# Patient Record
Sex: Female | Born: 1950 | Race: Black or African American | Hispanic: No | Smoking: Never smoker
Health system: Southern US, Community
[De-identification: ages and names within clinical notes are randomized; demographics above are authoritative.]

## PROBLEM LIST (undated history)

## (undated) DIAGNOSIS — F419 Anxiety disorder, unspecified: Secondary | ICD-10-CM

## (undated) DIAGNOSIS — I10 Essential (primary) hypertension: Secondary | ICD-10-CM

## (undated) DIAGNOSIS — G4700 Insomnia, unspecified: Secondary | ICD-10-CM

## (undated) DIAGNOSIS — K219 Gastro-esophageal reflux disease without esophagitis: Secondary | ICD-10-CM

## (undated) DIAGNOSIS — R0789 Other chest pain: Secondary | ICD-10-CM

## (undated) DIAGNOSIS — E785 Hyperlipidemia, unspecified: Secondary | ICD-10-CM

## (undated) DIAGNOSIS — F32A Depression, unspecified: Secondary | ICD-10-CM

## (undated) DIAGNOSIS — G4730 Sleep apnea, unspecified: Secondary | ICD-10-CM

## (undated) HISTORY — PX: BREAST EXCISIONAL BIOPSY: SUR124

## (undated) HISTORY — PX: ABDOMINAL HYSTERECTOMY: SHX81

## (undated) HISTORY — DX: Sleep apnea, unspecified: G47.30

## (undated) HISTORY — PX: BIOPSY BREAST: PRO8

## (undated) HISTORY — DX: Depression, unspecified: F32.A

---

## 2004-10-30 ENCOUNTER — Other Ambulatory Visit: Payer: Self-pay

## 2004-10-30 ENCOUNTER — Ambulatory Visit: Payer: Self-pay | Admitting: Unknown Physician Specialty

## 2008-10-26 ENCOUNTER — Emergency Department: Payer: Self-pay | Admitting: Emergency Medicine

## 2011-02-22 ENCOUNTER — Emergency Department: Payer: Self-pay | Admitting: Emergency Medicine

## 2011-05-25 ENCOUNTER — Ambulatory Visit: Payer: Self-pay | Admitting: Obstetrics and Gynecology

## 2012-01-03 ENCOUNTER — Ambulatory Visit: Payer: Self-pay | Admitting: Unknown Physician Specialty

## 2012-01-03 HISTORY — PX: COLONOSCOPY: SHX174

## 2012-04-02 ENCOUNTER — Emergency Department: Payer: Self-pay | Admitting: Internal Medicine

## 2012-04-02 LAB — URINALYSIS, COMPLETE
Bacteria: NONE SEEN
Bilirubin,UR: NEGATIVE
Blood: NEGATIVE
Glucose,UR: NEGATIVE mg/dL
Ketone: NEGATIVE
Leukocyte Esterase: NEGATIVE
Nitrite: NEGATIVE
Ph: 6
Protein: NEGATIVE
RBC,UR: <1 /HPF
Specific Gravity: 1.012
Squamous Epithelial: NONE SEEN
WBC UR: 1 /HPF

## 2012-04-02 LAB — COMPREHENSIVE METABOLIC PANEL WITH GFR
Albumin: 3.7 g/dL
Alkaline Phosphatase: 134 U/L
Anion Gap: 7
BUN: 13 mg/dL
Bilirubin,Total: 0.4 mg/dL
Calcium, Total: 8.9 mg/dL
Chloride: 108 mmol/L — ABNORMAL HIGH
Co2: 26 mmol/L
Creatinine: 0.97 mg/dL
EGFR (African American): >60
EGFR (Non-African Amer.): >60
Glucose: 120 mg/dL — ABNORMAL HIGH
Osmolality: 283
Potassium: 3.2 mmol/L — ABNORMAL LOW
SGOT(AST): 33 U/L
SGPT (ALT): 33 U/L
Sodium: 141 mmol/L
Total Protein: 7.5 g/dL

## 2012-04-02 LAB — CBC
HCT: 34.5 % — ABNORMAL LOW
HGB: 11.5 g/dL — ABNORMAL LOW
MCH: 29.7 pg
MCHC: 33.3 g/dL
MCV: 89 fL
Platelet: 266 x10 3/mm 3
RBC: 3.86 X10 6/mm 3
RDW: 14.8 % — ABNORMAL HIGH
WBC: 5.3 x10 3/mm 3

## 2012-04-02 LAB — TROPONIN I: Troponin-I: <0.02 ng/mL

## 2014-03-09 ENCOUNTER — Emergency Department: Payer: Self-pay | Admitting: Emergency Medicine

## 2014-03-09 LAB — BASIC METABOLIC PANEL
ANION GAP: 7 (ref 7–16)
BUN: 16 mg/dL (ref 7–18)
CALCIUM: 8.5 mg/dL (ref 8.5–10.1)
CHLORIDE: 107 mmol/L (ref 98–107)
CO2: 31 mmol/L (ref 21–32)
CREATININE: 1.04 mg/dL (ref 0.60–1.30)
EGFR (Non-African Amer.): 57 — ABNORMAL LOW
Glucose: 107 mg/dL — ABNORMAL HIGH (ref 65–99)
Osmolality: 290 (ref 275–301)
Potassium: 3.4 mmol/L — ABNORMAL LOW (ref 3.5–5.1)
Sodium: 145 mmol/L (ref 136–145)

## 2014-03-09 LAB — CBC
HCT: 37.5 % (ref 35.0–47.0)
HGB: 12.3 g/dL (ref 12.0–16.0)
MCH: 29.3 pg (ref 26.0–34.0)
MCHC: 32.7 g/dL (ref 32.0–36.0)
MCV: 90 fL (ref 80–100)
PLATELETS: 289 10*3/uL (ref 150–440)
RBC: 4.18 10*6/uL (ref 3.80–5.20)
RDW: 14.8 % — AB (ref 11.5–14.5)
WBC: 5.8 10*3/uL (ref 3.6–11.0)

## 2014-03-09 LAB — TROPONIN I: Troponin-I: <0.02 ng/mL

## 2014-03-09 LAB — HEPATIC FUNCTION PANEL A (ARMC)
ALBUMIN: 3.9 g/dL (ref 3.4–5.0)
ALK PHOS: 133 U/L — AB
Bilirubin, Direct: <0.1 mg/dL (ref 0.0–0.2)
Bilirubin,Total: 0.2 mg/dL (ref 0.2–1.0)
SGOT(AST): 29 U/L (ref 15–37)
SGPT (ALT): 52 U/L
Total Protein: 8 g/dL (ref 6.4–8.2)

## 2014-03-09 LAB — LIPASE, BLOOD: LIPASE: 121 U/L (ref 73–393)

## 2014-03-10 LAB — URINALYSIS, COMPLETE
BILIRUBIN, UR: NEGATIVE
Bacteria: NONE SEEN
Blood: NEGATIVE
GLUCOSE, UR: NEGATIVE mg/dL (ref 0–75)
KETONE: NEGATIVE
Leukocyte Esterase: NEGATIVE
Nitrite: NEGATIVE
Ph: 5 (ref 4.5–8.0)
Protein: NEGATIVE
RBC,UR: 1 /HPF (ref 0–5)
SPECIFIC GRAVITY: 1.012 (ref 1.003–1.030)
WBC UR: NONE SEEN /HPF (ref 0–5)

## 2014-10-11 ENCOUNTER — Emergency Department
Admission: EM | Admit: 2014-10-11 | Discharge: 2014-10-12 | Disposition: A | Discharge status: Home / Self Care | Payer: No Typology Code available for payment source | Attending: Emergency Medicine | Admitting: Emergency Medicine

## 2014-10-11 DIAGNOSIS — Z7951 Long term (current) use of inhaled steroids: Secondary | ICD-10-CM | POA: Diagnosis not present

## 2014-10-11 DIAGNOSIS — Z79899 Other long term (current) drug therapy: Secondary | ICD-10-CM | POA: Diagnosis not present

## 2014-10-11 DIAGNOSIS — R350 Frequency of micturition: Secondary | ICD-10-CM | POA: Diagnosis present

## 2014-10-11 DIAGNOSIS — I10 Essential (primary) hypertension: Secondary | ICD-10-CM | POA: Insufficient documentation

## 2014-10-11 DIAGNOSIS — Z880 Allergy status to penicillin: Secondary | ICD-10-CM | POA: Diagnosis not present

## 2014-10-11 DIAGNOSIS — N10 Acute tubulo-interstitial nephritis: Secondary | ICD-10-CM | POA: Insufficient documentation

## 2014-10-11 DIAGNOSIS — N12 Tubulo-interstitial nephritis, not specified as acute or chronic: Secondary | ICD-10-CM

## 2014-10-11 DIAGNOSIS — R000 Tachycardia, unspecified: Secondary | ICD-10-CM | POA: Diagnosis not present

## 2014-10-11 HISTORY — DX: Insomnia, unspecified: G47.00

## 2014-10-11 HISTORY — DX: Essential (primary) hypertension: I10

## 2014-10-11 HISTORY — DX: Gastro-esophageal reflux disease without esophagitis: K21.9

## 2014-10-11 HISTORY — DX: Hyperlipidemia, unspecified: E78.5

## 2014-10-11 HISTORY — DX: Anxiety disorder, unspecified: F41.9

## 2014-10-11 LAB — CBC
HCT: 40.8 % (ref 35.0–47.0)
Hemoglobin: 13.2 g/dL (ref 12.0–16.0)
MCH: 28.8 pg (ref 26.0–34.0)
MCHC: 32.5 g/dL (ref 32.0–36.0)
MCV: 88.7 fL (ref 80.0–100.0)
Platelets: 295 10*3/uL (ref 150–440)
RBC: 4.6 MIL/uL (ref 3.80–5.20)
RDW: 14.7 % — ABNORMAL HIGH (ref 11.5–14.5)
WBC: 7.8 10*3/uL (ref 3.6–11.0)

## 2014-10-11 LAB — URINALYSIS COMPLETE WITH MICROSCOPIC (ARMC ONLY)
BILIRUBIN URINE: NEGATIVE
GLUCOSE, UA: NEGATIVE mg/dL
KETONES UR: NEGATIVE mg/dL
Nitrite: NEGATIVE
Protein, ur: 100 mg/dL — AB
SPECIFIC GRAVITY, URINE: 1.011 (ref 1.005–1.030)
pH: 6 (ref 5.0–8.0)

## 2014-10-11 LAB — COMPREHENSIVE METABOLIC PANEL
ALT: 21 U/L (ref 14–54)
AST: 25 U/L (ref 15–41)
Albumin: 4.5 g/dL (ref 3.5–5.0)
Alkaline Phosphatase: 121 U/L (ref 38–126)
Anion gap: 9 (ref 5–15)
BUN: 10 mg/dL (ref 6–20)
CALCIUM: 9.4 mg/dL (ref 8.9–10.3)
CO2: 30 mmol/L (ref 22–32)
Chloride: 98 mmol/L — ABNORMAL LOW (ref 101–111)
Creatinine, Ser: 1.07 mg/dL — ABNORMAL HIGH (ref 0.44–1.00)
GFR calc Af Amer: >60 mL/min (ref >60)
GFR calc non Af Amer: 53 mL/min — ABNORMAL LOW (ref >60)
GLUCOSE: 129 mg/dL — AB (ref 65–99)
Potassium: 3 mmol/L — ABNORMAL LOW (ref 3.5–5.1)
Sodium: 137 mmol/L (ref 135–145)
Total Bilirubin: 0.6 mg/dL (ref 0.3–1.2)
Total Protein: 8.2 g/dL — ABNORMAL HIGH (ref 6.5–8.1)

## 2014-10-11 NOTE — ED Notes (Signed)
Patient to ED with c/o urinary frequency and pressure for the last 3 days, today reports she began having chills and pain to both flanks.

## 2014-10-12 MED ORDER — SODIUM CHLORIDE 0.9 % IV BOLUS (SEPSIS)
1000.0000 mL | Freq: Once | INTRAVENOUS | Status: AC
  Administered 2014-10-12: 1000 mL via INTRAVENOUS

## 2014-10-12 MED ORDER — ACETAMINOPHEN 500 MG PO TABS
ORAL_TABLET | ORAL | Status: AC
  Administered 2014-10-12: 1000 mg via ORAL
  Filled 2014-10-12: qty 2

## 2014-10-12 MED ORDER — LEVOFLOXACIN IN D5W 750 MG/150ML IV SOLN
750.0000 mg | Freq: Once | INTRAVENOUS | Status: AC
  Administered 2014-10-12: 750 mg via INTRAVENOUS

## 2014-10-12 MED ORDER — LEVOFLOXACIN IN D5W 750 MG/150ML IV SOLN
INTRAVENOUS | Status: AC
  Administered 2014-10-12: 750 mg via INTRAVENOUS
  Filled 2014-10-12: qty 150

## 2014-10-12 MED ORDER — ACETAMINOPHEN 500 MG PO TABS
1000.0000 mg | ORAL_TABLET | Freq: Once | ORAL | Status: AC
  Administered 2014-10-12: 1000 mg via ORAL

## 2014-10-12 MED ORDER — LEVOFLOXACIN 750 MG PO TABS: 750.0000 mg | ORAL_TABLET | Freq: Every day | ORAL | Status: AC

## 2014-10-12 NOTE — ED Provider Notes (Signed)
[facility] Emergency Department Provider Note  ____________________________________________  Time seen: Approximately 12 AM  I have reviewed the triage vital signs and the nursing notes.   HISTORY  Chief Complaint Urinary Frequency and Chills    HPI Sierra Lewis is a 64 y.o. female who presents with 3 days of chills as well as pain to both flanks and her frequency and pressure.  No fever at home. No nausea or vomiting or diarrhea. Notes associated pressure in her lower abdomen. Denies vaginal discharge or bleeding. Pain is mild.   Past Medical History  Diagnosis Date  . Hypertension   . Hyperlipidemia   . GERD (gastroesophageal reflux disease)   . Insomnia   . Anxiety     There are no active problems to display for this patient.   Past Surgical History  Procedure Laterality Date  . Abdominal hysterectomy    . Biopsy breast      Current Outpatient Rx  Name  Route  Sig  Dispense  Refill  . ALPRAZolam (XANAX) 1 MG tablet   Oral   Take 1 mg by mouth 3 (three) times daily.         . cetirizine (ZYRTEC) 10 MG tablet   Oral   Take 10 mg by mouth daily.         . fluticasone (FLONASE) 50 MCG/ACT nasal spray   Each Nare   Place 2 sprays into both nostrils daily.         Marland Kitchen lisinopril-hydrochlorothiazide (PRINZIDE,ZESTORETIC) 20-25 MG per tablet   Oral   Take 1 tablet by mouth daily.         . metoprolol succinate (TOPROL-XL) 50 MG 24 hr tablet   Oral   Take 50 mg by mouth daily. Take with or immediately following a meal.         . omeprazole (PRILOSEC) 40 MG capsule   Oral   Take 40 mg by mouth daily.         Marland Kitchen PARoxetine (PAXIL) 20 MG tablet   Oral   Take 20 mg by mouth at bedtime.         . simvastatin (ZOCOR) 40 MG tablet   Oral   Take 40 mg by mouth at bedtime.         Marland Kitchen zolpidem (AMBIEN) 10 MG tablet   Oral   Take 10 mg by mouth at bedtime as needed for sleep.           Allergies Benadryl and  Penicillins  No family history on file.  Social History History  Substance Use Topics  . Smoking status: Never Smoker   . Smokeless tobacco: Not on file  . Alcohol Use: Yes     Comment: Wine minimal...every other week.    Review of Systems Constitutional: chills Eyes: No visual changes. ENT: No sore throat. Cardiovascular: Denies chest pain. Respiratory: Denies shortness of breath. Gastrointestinal: No nausea, no vomiting.  No diarrhea.  No constipation. Genitourinary: Negative for dysuria. Musculoskeletal: Bilateral flank pain  Skin: Negative for rash. Neurological: Negative for headaches, focal weakness or numbness.  10-point ROS otherwise negative.  ____________________________________________   PHYSICAL EXAM:  VITAL SIGNS: ED Triage Vitals  Enc Vitals Group     BP 10/11/14 2204 168/108 mmHg     Pulse Rate 10/11/14 2204 94     Resp 10/11/14 2230 15     Temp 10/11/14 2204 100.2 F (37.9 C)     Temp Source 10/11/14 2204 Oral  SpO2 10/11/14 2230 98 %     Weight 10/11/14 2204 185 lb (83.915 kg)     Height 10/11/14 2204 5\' 1"  (1.549 m)     Head Cir --      Peak Flow --      Pain Score 10/11/14 2157 6     Pain Loc --      Pain Edu? --      Excl. in [place]? --     Constitutional: Alert and oriented. Well appearing and in no acute distress. Eyes: Conjunctivae are normal. PERRL. EOMI. Head: Atraumatic. Nose: No congestion/rhinnorhea. Mouth/Throat: Mucous membranes are moist.  Oropharynx non-erythematous. Neck: No stridor.   Cardiovascular: Tachycardic with regular rhythm. Grossly normal heart sounds.  Good peripheral circulation. Respiratory: Normal respiratory effort.  No retractions. Lungs CTAB. Gastrointestinal: Soft with mild suprapubic tenderness. No distention. No abdominal bruits. A lateral CVA tenderness palpation  Musculoskeletal: No lower extremity tenderness nor edema.  No joint effusions. Neurologic:  Normal speech and language. No gross focal  neurologic deficits are appreciated. Speech is normal. No gait instability. Skin:  Skin is warm, dry and intact. No rash noted. Psychiatric: Mood and affect are normal. Speech and behavior are normal.  ____________________________________________   LABS (all labs ordered are listed, but only abnormal results are displayed)  Labs Reviewed  URINALYSIS COMPLETEWITH MICROSCOPIC ([facility] ONLY) - Abnormal; Notable for the following:    Color, Urine YELLOW (*)    APPearance CLOUDY (*)    Hgb urine dipstick 1+ (*)    Protein, ur 100 (*)    Leukocytes, UA 3+ (*)    Bacteria, UA RARE (*)    Squamous Epithelial / LPF 0-5 (*)    All other components within normal limits  COMPREHENSIVE METABOLIC PANEL - Abnormal; Notable for the following:    Potassium 3.0 (*)    Chloride 98 (*)    Glucose, Bld 129 (*)    Creatinine, Ser 1.07 (*)    Total Protein 8.2 (*)    GFR calc non Af Amer 53 (*)    All other components within normal limits  CBC - Abnormal; Notable for the following:    RDW 14.7 (*)    All other components within normal limits  URINE CULTURE   ____________________________________________  EKG   ____________________________________________  RADIOLOGY   ____________________________________________   PROCEDURES   ____________________________________________   INITIAL IMPRESSION / ASSESSMENT AND PLAN / ED COURSE  Pertinent labs & imaging results that were available during my care of the patient were reviewed by me and considered in my medical decision making (see chart for details).  ----------------------------------------- 4:48 AM on 10/12/2014 -----------------------------------------  Patient resting comfortable at this time. Tolerating by mouth. Likely pyelonephritis. We'll discharge with several days more of Levaquin. Patient has defervesced and vital signs have improved. ____________________________________________   FINAL CLINICAL IMPRESSION(S) / ED  DIAGNOSES  Acute pyelonephritis. Initial visit.    Orbie Pyo, MD 10/12/14 [phone]

## 2014-10-12 NOTE — ED Notes (Signed)
MD at bedside.  Dr Orlean Bradford.

## 2014-10-12 NOTE — Discharge Instructions (Signed)
Pyelonephritis, Adult °Pyelonephritis is a kidney infection. A kidney infection can happen quickly, or it can last for a long time. °HOME CARE  °· Take your medicine (antibiotics) as told. Finish it even if you start to feel better. °· Keep all doctor visits as told. °· Drink enough fluids to keep your pee (urine) clear or pale yellow. °· Only take medicine as told by your doctor. °GET HELP RIGHT AWAY IF:  °· You have a fever or lasting symptoms for more than 2-3 days. °· You have a fever and your symptoms suddenly get worse. °· You cannot take your medicine or drink fluids as told. °· You have chills and shaking. °· You feel very weak or pass out (faint). °· You do not feel better after 2 days. °MAKE SURE YOU: °· Understand these instructions. °· Will watch your condition. °· Will get help right away if you are not doing well or get worse. °Document Released: 05/20/2004 Document Revised: 10/12/2011 Document Reviewed: 09/30/2010 °ExitCare® Patient Information ©2015 [employer]. This information is not intended to replace advice given to you by your health care provider. Make sure you discuss any questions you have with your health care provider. ° °

## 2014-10-14 LAB — URINE CULTURE

## 2015-04-14 DIAGNOSIS — F339 Major depressive disorder, recurrent, unspecified: Secondary | ICD-10-CM | POA: Insufficient documentation

## 2015-04-14 DIAGNOSIS — F1090 Alcohol use, unspecified, uncomplicated: Secondary | ICD-10-CM | POA: Insufficient documentation

## 2015-04-19 DIAGNOSIS — F419 Anxiety disorder, unspecified: Secondary | ICD-10-CM | POA: Insufficient documentation

## 2015-04-19 DIAGNOSIS — G3184 Mild cognitive impairment, so stated: Secondary | ICD-10-CM | POA: Insufficient documentation

## 2015-05-28 ENCOUNTER — Emergency Department: Payer: No Typology Code available for payment source

## 2015-05-28 ENCOUNTER — Emergency Department
Admission: EM | Admit: 2015-05-28 | Discharge: 2015-05-28 | Disposition: A | Discharge status: Home / Self Care | Payer: No Typology Code available for payment source | Attending: Emergency Medicine | Admitting: Emergency Medicine

## 2015-05-28 DIAGNOSIS — R0602 Shortness of breath: Secondary | ICD-10-CM | POA: Insufficient documentation

## 2015-05-28 DIAGNOSIS — Z880 Allergy status to penicillin: Secondary | ICD-10-CM | POA: Insufficient documentation

## 2015-05-28 DIAGNOSIS — R110 Nausea: Secondary | ICD-10-CM | POA: Insufficient documentation

## 2015-05-28 DIAGNOSIS — I10 Essential (primary) hypertension: Secondary | ICD-10-CM | POA: Insufficient documentation

## 2015-05-28 DIAGNOSIS — Z79899 Other long term (current) drug therapy: Secondary | ICD-10-CM | POA: Insufficient documentation

## 2015-05-28 DIAGNOSIS — R079 Chest pain, unspecified: Secondary | ICD-10-CM | POA: Insufficient documentation

## 2015-05-28 LAB — CBC
HCT: 36.1 % (ref 35.0–47.0)
Hemoglobin: 12.1 g/dL (ref 12.0–16.0)
MCH: 28.8 pg (ref 26.0–34.0)
MCHC: 33.5 g/dL (ref 32.0–36.0)
MCV: 85.9 fL (ref 80.0–100.0)
Platelets: 303 10*3/uL (ref 150–440)
RBC: 4.2 MIL/uL (ref 3.80–5.20)
RDW: 14.1 % (ref 11.5–14.5)
WBC: 6.3 10*3/uL (ref 3.6–11.0)

## 2015-05-28 LAB — COMPREHENSIVE METABOLIC PANEL
ALT: 19 U/L (ref 14–54)
AST: 19 U/L (ref 15–41)
Albumin: 4.2 g/dL (ref 3.5–5.0)
Alkaline Phosphatase: 110 U/L (ref 38–126)
Anion gap: 6 (ref 5–15)
BUN: 12 mg/dL (ref 6–20)
CHLORIDE: 102 mmol/L (ref 101–111)
CO2: 29 mmol/L (ref 22–32)
CREATININE: 0.93 mg/dL (ref 0.44–1.00)
Calcium: 9.6 mg/dL (ref 8.9–10.3)
GFR calc non Af Amer: >60 mL/min (ref >60)
GLUCOSE: 102 mg/dL — AB (ref 65–99)
Potassium: 3.2 mmol/L — ABNORMAL LOW (ref 3.5–5.1)
SODIUM: 137 mmol/L (ref 135–145)
Total Bilirubin: 0.4 mg/dL (ref 0.3–1.2)
Total Protein: 7.7 g/dL (ref 6.5–8.1)

## 2015-05-28 LAB — TROPONIN I
Troponin I: <0.03 ng/mL (ref <0.031)
Troponin I: <0.03 ng/mL (ref <0.031)

## 2015-05-28 LAB — BRAIN NATRIURETIC PEPTIDE: B NATRIURETIC PEPTIDE 5: 8 pg/mL (ref 0.0–100.0)

## 2015-05-28 MED ORDER — IBUPROFEN 600 MG PO TABS
600.0000 mg | ORAL_TABLET | Freq: Once | ORAL | Status: AC
  Administered 2015-05-28: 600 mg via ORAL
  Filled 2015-05-28: qty 1

## 2015-05-28 MED ORDER — NITROGLYCERIN 0.4 MG SL SUBL
0.4000 mg | SUBLINGUAL_TABLET | SUBLINGUAL | Status: DC | PRN
  Administered 2015-05-28 (×2): 0.4 mg via SUBLINGUAL
  Filled 2015-05-28: qty 2

## 2015-05-28 NOTE — ED Provider Notes (Signed)
[facility] Emergency Department Provider Note  ____________________________________________  Time seen: Approximately [id number] AM  I have reviewed the triage vital signs and the nursing notes.   HISTORY  Chief Complaint Chest Pain    HPI Sierra Lewis is a 65 y.o. female who comes into the hospital today with some chest pain. The patient reports that she started having chest pain in December. She reports that the pain is been coming and going but it has been bothering her every day. She reports it seems to be getting worse. The patient does not take anything for the pain. She reports that she has talked to her doctor but they have not done anything for her pain. The patient rates her pain a 7-8 out of 10 in intensity and says it is sharp. She reports it hurts when her heart beats faster off beat. She said some mild shortness of breath with no sweats and no vomiting. The patient also endorses some nausea with some upper left back pain. She denies any pain with deep inspiration but reports that if her heart beats faster it does hurt. The patient cannot tolerate the pain anymore so she decided to come in for evaluation.   Past Medical History  Diagnosis Date  . Hypertension   . Hyperlipidemia   . GERD (gastroesophageal reflux disease)   . Insomnia   . Anxiety     There are no active problems to display for this patient.   Past Surgical History  Procedure Laterality Date  . Abdominal hysterectomy    . Biopsy breast      Current Outpatient Rx  Name  Route  Sig  Dispense  Refill  . amLODipine (NORVASC) 5 MG tablet   Oral   Take 5 mg by mouth daily.         . cetirizine (ZYRTEC) 10 MG tablet   Oral   Take 10 mg by mouth daily.         Marland Kitchen FLUoxetine (PROZAC) 40 MG capsule   Oral   Take 40 mg by mouth daily.         . fluticasone (FLONASE) 50 MCG/ACT nasal spray   Each Nare   Place 2 sprays into both nostrils daily.         Marland Kitchen  lisinopril-hydrochlorothiazide (PRINZIDE,ZESTORETIC) 20-25 MG per tablet   Oral   Take 1 tablet by mouth daily.         . simvastatin (ZOCOR) 40 MG tablet   Oral   Take 40 mg by mouth at bedtime.         . traZODone (DESYREL) 100 MG tablet   Oral   Take 100 mg by mouth at bedtime.         . traZODone (DESYREL) 50 MG tablet   Oral   Take 25 mg by mouth 2 (two) times daily.           Allergies Benadryl and Penicillins  No family history on file.  Social History Social History  Substance Use Topics  . Smoking status: Never Smoker   . Smokeless tobacco: Not on file  . Alcohol Use: Yes     Comment: Wine minimal...every other week.    Review of Systems Constitutional: No fever/chills Eyes: No visual changes. ENT: No sore throat. Cardiovascular:  chest pain. Respiratory:  shortness of breath. Gastrointestinal: Nausea with No abdominal pain.  No nausea, no vomiting.  No diarrhea.  No constipation. Genitourinary: Negative for dysuria. Musculoskeletal: Negative for back  pain. Skin: Negative for rash. Neurological: Negative for headaches, focal weakness or numbness.  10-point ROS otherwise negative.  ____________________________________________   PHYSICAL EXAM:  VITAL SIGNS: ED Triage Vitals  Enc Vitals Group     BP 05/28/15 0020 138/72 mmHg     Pulse Rate 05/28/15 0020 93     Resp 05/28/15 0020 18     Temp 05/28/15 0020 98.3 F (36.8 C)     Temp Source 05/28/15 0020 Oral     SpO2 05/28/15 0020 97 %     Weight 05/28/15 0020 185 lb (83.915 kg)     Height 05/28/15 0020 5\' 2"  (1.575 m)     Head Cir --      Peak Flow --      Pain Score 05/28/15 0021 7     Pain Loc --      Pain Edu? --      Excl. in [place]? --     Constitutional: Alert and oriented. Well appearing and in mild distress. Eyes: Conjunctivae are normal. PERRL. EOMI. Head: Atraumatic. Nose: No congestion/rhinnorhea. Mouth/Throat: Mucous membranes are moist.  Oropharynx  non-erythematous. Cardiovascular: Normal rate, regular rhythm. Grossly normal heart sounds.  Good peripheral circulation. Respiratory: Normal respiratory effort.  No retractions. Lungs CTAB. Gastrointestinal: Soft and nontender. No distention. Positive bowel sounds Musculoskeletal: No lower extremity tenderness nor edema.   Neurologic:  Normal speech and language.  Skin:  Skin is warm, dry and intact.  Psychiatric: Mood and affect are normal.   ____________________________________________   LABS (all labs ordered are listed, but only abnormal results are displayed)  Labs Reviewed  COMPREHENSIVE METABOLIC PANEL - Abnormal; Notable for the following:    Potassium 3.2 (*)    Glucose, Bld 102 (*)    All other components within normal limits  CBC  TROPONIN I  BRAIN NATRIURETIC PEPTIDE  TROPONIN I   ____________________________________________  EKG  ED ECG REPORT I, Loney Hering, the attending physician, personally viewed and interpreted this ECG.   Date: 05/28/2015  EKG Time: 024  Rate: 87  Rhythm: normal sinus rhythm  Axis: normal  Intervals:none  ST&T Change: none  ____________________________________________  RADIOLOGY  CXR: No active cardiopulmonary disease ____________________________________________   PROCEDURES  Procedure(s) performed: None  Critical Care performed: No  ____________________________________________   INITIAL IMPRESSION / ASSESSMENT AND PLAN / ED COURSE  Pertinent labs & imaging results that were available during my care of the patient were reviewed by me and considered in my medical decision making (see chart for details).  This is a 65 year old female who comes into the hospital with over one month of chest pain. The patient reports the pain has been coming every day. She has not been taking anything for the pain. The patient's first set of troponins are negative but I will repeat the patient's troponin and give her some glycerin  to determine if that does alleviate the pain.  I did go into the patient and she reports that the medication she received which was nitroglycerin did help decrease her blood pressure but she did still have some mild chest discomfort. I informed her that she had 2 sets of cardiac enzymes that were negative and she probably needed to see a cardiologist she asked if that was a heart doctor and then reported that she really has an appointment scheduled on Friday. I did give the patient some ibuprofen for the pain and she is ambulatory without any difficulty. The patient is in no acute distress and  has no elevation of her heart enzymes. Patient will be discharged home to follow-up with cardiology for further evaluation of her chest pain. ____________________________________________   FINAL CLINICAL IMPRESSION(S) / ED DIAGNOSES  Final diagnoses:  Chest pain, unspecified chest pain type      Loney Hering, MD 05/28/15 [phone]

## 2015-05-28 NOTE — ED Notes (Signed)
Pt in with co chest pain for a month and increased edema to ankles.

## 2015-05-28 NOTE — Discharge Instructions (Signed)
Nonspecific Chest Pain  °Chest pain can be caused by many different conditions. There is always a chance that your pain could be related to something serious, such as a heart attack or a blood clot in your lungs. Chest pain can also be caused by conditions that are not life-threatening. If you have chest pain, it is very important to follow up with your health care provider. °CAUSES  °Chest pain can be caused by: °· Heartburn. °· Pneumonia or bronchitis. °· Anxiety or stress. °· Inflammation around your heart (pericarditis) or lung (pleuritis or pleurisy). °· A blood clot in your lung. °· A collapsed lung (pneumothorax). It can develop suddenly on its own (spontaneous pneumothorax) or from trauma to the chest. °· Shingles infection (varicella-zoster virus). °· Heart attack. °· Damage to the bones, muscles, and cartilage that make up your chest wall. This can include: °¨ Bruised bones due to injury. °¨ Strained muscles or cartilage due to frequent or repeated coughing or overwork. °¨ Fracture to one or more ribs. °¨ Sore cartilage due to inflammation (costochondritis). °RISK FACTORS  °Risk factors for chest pain may include: °· Activities that increase your risk for trauma or injury to your chest. °· Respiratory infections or conditions that cause frequent coughing. °· Medical conditions or overeating that can cause heartburn. °· Heart disease or family history of heart disease. °· Conditions or health behaviors that increase your risk of developing a blood clot. °· Having had chicken pox (varicella zoster). °SIGNS AND SYMPTOMS °Chest pain can feel like: °· Burning or tingling on the surface of your chest or deep in your chest. °· Crushing, pressure, aching, or squeezing pain. °· Dull or sharp pain that is worse when you move, cough, or take a deep breath. °· Pain that is also felt in your back, neck, shoulder, or arm, or pain that spreads to any of these areas. °Your chest pain may come and go, or it may stay  constant. °DIAGNOSIS °Lab tests or other studies may be needed to find the cause of your pain. Your health care provider may have you take a test called an ambulatory ECG (electrocardiogram). An ECG records your heartbeat patterns at the time the test is performed. You may also have other tests, such as: °· Transthoracic echocardiogram (TTE). During echocardiography, sound waves are used to create a picture of all of the heart structures and to look at how blood flows through your heart. °· Transesophageal echocardiogram (TEE). This is a more advanced imaging test that obtains images from inside your body. It allows your health care provider to see your heart in finer detail. °· Cardiac monitoring. This allows your health care provider to monitor your heart rate and rhythm in real time. °· Holter monitor. This is a portable device that records your heartbeat and can help to diagnose abnormal heartbeats. It allows your health care provider to track your heart activity for several days, if needed. °· Stress tests. These can be done through exercise or by taking medicine that makes your heart beat more quickly. °· Blood tests. °· Imaging tests. °TREATMENT  °Your treatment depends on what is causing your chest pain. Treatment may include: °· Medicines. These may include: °¨ Acid blockers for heartburn. °¨ Anti-inflammatory medicine. °¨ Pain medicine for inflammatory conditions. °¨ Antibiotic medicine, if an infection is present. °¨ Medicines to dissolve blood clots. °¨ Medicines to treat coronary artery disease. °· Supportive care for conditions that do not require medicines. This may include: °¨ Resting. °¨ Applying heat   or cold packs to injured areas. °¨ Limiting activities until pain decreases. °HOME CARE INSTRUCTIONS °· If you were prescribed an antibiotic medicine, finish it all even if you start to feel better. °· Avoid any activities that bring on chest pain. °· Do not use any tobacco products, including  cigarettes, chewing tobacco, or electronic cigarettes. If you need help quitting, ask your health care provider. °· Do not drink alcohol. °· Take medicines only as directed by your health care provider. °· Keep all follow-up visits as directed by your health care provider. This is important. This includes any further testing if your chest pain does not go away. °· If heartburn is the cause for your chest pain, you may be told to keep your head raised (elevated) while sleeping. This reduces the chance that acid will go from your stomach into your esophagus. °· Make lifestyle changes as directed by your health care provider. These may include: °¨ Getting regular exercise. Ask your health care provider to suggest some activities that are safe for you. °¨ Eating a heart-healthy diet. A registered dietitian can help you to learn healthy eating options. °¨ Maintaining a healthy weight. °¨ Managing diabetes, if necessary. °¨ Reducing stress. °SEEK MEDICAL CARE IF: °· Your chest pain does not go away after treatment. °· You have a rash with blisters on your chest. °· You have a fever. °SEEK IMMEDIATE MEDICAL CARE IF:  °· Your chest pain is worse. °· You have an increasing cough, or you cough up blood. °· You have severe abdominal pain. °· You have severe weakness. °· You faint. °· You have chills. °· You have sudden, unexplained chest discomfort. °· You have sudden, unexplained discomfort in your arms, back, neck, or jaw. °· You have shortness of breath at any time. °· You suddenly start to sweat, or your skin gets clammy. °· You feel nauseous or you vomit. °· You suddenly feel light-headed or dizzy. °· Your heart begins to beat quickly, or it feels like it is skipping beats. °These symptoms may represent a serious problem that is an emergency. Do not wait to see if the symptoms will go away. Get medical help right away. Call your local emergency services (911 in the [place] Do not drive yourself to the hospital. °  °This  information is not intended to replace advice given to you by your health care provider. Make sure you discuss any questions you have with your health care provider. °  °Document Released: 01/20/2005 Document Revised: 05/03/2014 Document Reviewed: 11/16/2013 °[employer]vier Interactive Patient Education ©2016 [employer] ° °

## 2015-05-28 NOTE — ED Notes (Signed)
NAD noted at time of D/C. Pt denies questions or concerns. Pt ambulatory to the lobby at this time. Pt refused wheelchair to the lobby.  

## 2015-05-30 DIAGNOSIS — Z87898 Personal history of other specified conditions: Secondary | ICD-10-CM | POA: Insufficient documentation

## 2015-06-02 ENCOUNTER — Other Ambulatory Visit: Payer: Self-pay | Admitting: Cardiology

## 2015-06-02 DIAGNOSIS — R10A1 Flank pain, right side: Secondary | ICD-10-CM

## 2015-06-02 DIAGNOSIS — R109 Unspecified abdominal pain: Secondary | ICD-10-CM

## 2015-06-02 DIAGNOSIS — N23 Unspecified renal colic: Secondary | ICD-10-CM

## 2015-06-05 ENCOUNTER — Ambulatory Visit
Admission: RE | Admit: 2015-06-05 | Discharge: 2015-06-05 | Disposition: A | Discharge status: Home / Self Care | Payer: Medicare Other | Source: Ambulatory Visit | Attending: Cardiology | Admitting: Cardiology

## 2015-06-05 DIAGNOSIS — R109 Unspecified abdominal pain: Secondary | ICD-10-CM | POA: Insufficient documentation

## 2015-06-05 DIAGNOSIS — N23 Unspecified renal colic: Secondary | ICD-10-CM | POA: Insufficient documentation

## 2018-01-31 ENCOUNTER — Observation Stay
Admission: EM | Admit: 2018-01-31 | Discharge: 2018-02-01 | Disposition: A | Discharge status: Home / Self Care | Payer: Medicare Other | Attending: Specialist | Admitting: Specialist

## 2018-01-31 ENCOUNTER — Other Ambulatory Visit: Payer: Self-pay

## 2018-01-31 ENCOUNTER — Emergency Department: Payer: Medicare Other

## 2018-01-31 DIAGNOSIS — Z880 Allergy status to penicillin: Secondary | ICD-10-CM | POA: Diagnosis not present

## 2018-01-31 DIAGNOSIS — R0789 Other chest pain: Principal | ICD-10-CM | POA: Insufficient documentation

## 2018-01-31 DIAGNOSIS — E785 Hyperlipidemia, unspecified: Secondary | ICD-10-CM | POA: Diagnosis not present

## 2018-01-31 DIAGNOSIS — G4700 Insomnia, unspecified: Secondary | ICD-10-CM | POA: Insufficient documentation

## 2018-01-31 DIAGNOSIS — F419 Anxiety disorder, unspecified: Secondary | ICD-10-CM | POA: Insufficient documentation

## 2018-01-31 DIAGNOSIS — F329 Major depressive disorder, single episode, unspecified: Secondary | ICD-10-CM | POA: Diagnosis not present

## 2018-01-31 DIAGNOSIS — R0602 Shortness of breath: Secondary | ICD-10-CM | POA: Diagnosis present

## 2018-01-31 DIAGNOSIS — R079 Chest pain, unspecified: Secondary | ICD-10-CM

## 2018-01-31 DIAGNOSIS — Z79899 Other long term (current) drug therapy: Secondary | ICD-10-CM | POA: Insufficient documentation

## 2018-01-31 DIAGNOSIS — Z23 Encounter for immunization: Secondary | ICD-10-CM | POA: Insufficient documentation

## 2018-01-31 DIAGNOSIS — K219 Gastro-esophageal reflux disease without esophagitis: Secondary | ICD-10-CM | POA: Diagnosis not present

## 2018-01-31 DIAGNOSIS — I700 Atherosclerosis of aorta: Secondary | ICD-10-CM | POA: Diagnosis not present

## 2018-01-31 DIAGNOSIS — I10 Essential (primary) hypertension: Secondary | ICD-10-CM | POA: Insufficient documentation

## 2018-01-31 DIAGNOSIS — E876 Hypokalemia: Secondary | ICD-10-CM | POA: Diagnosis present

## 2018-01-31 DIAGNOSIS — Z888 Allergy status to other drugs, medicaments and biological substances status: Secondary | ICD-10-CM | POA: Insufficient documentation

## 2018-01-31 LAB — CBC
HCT: 37.6 % (ref 36.0–46.0)
HEMOGLOBIN: 12.4 g/dL (ref 12.0–15.0)
MCH: 29.4 pg (ref 26.0–34.0)
MCHC: 33 g/dL (ref 30.0–36.0)
MCV: 89.1 fL (ref 80.0–100.0)
NRBC: 0 % (ref 0.0–0.2)
Platelets: 307 10*3/uL (ref 150–400)
RBC: 4.22 MIL/uL (ref 3.87–5.11)
RDW: 13.7 % (ref 11.5–15.5)
WBC: 4.8 10*3/uL (ref 4.0–10.5)

## 2018-01-31 LAB — BASIC METABOLIC PANEL
ANION GAP: 7 (ref 5–15)
BUN: 14 mg/dL (ref 8–23)
CHLORIDE: 105 mmol/L (ref 98–111)
CO2: 27 mmol/L (ref 22–32)
CREATININE: 0.83 mg/dL (ref 0.44–1.00)
Calcium: 9.4 mg/dL (ref 8.9–10.3)
GFR calc non Af Amer: >60 mL/min (ref >60)
Glucose, Bld: 157 mg/dL — ABNORMAL HIGH (ref 70–99)
POTASSIUM: 3.2 mmol/L — AB (ref 3.5–5.1)
Sodium: 139 mmol/L (ref 135–145)

## 2018-01-31 LAB — HEPATIC FUNCTION PANEL
ALK PHOS: 90 U/L (ref 38–126)
ALT: 17 U/L (ref 0–44)
AST: 22 U/L (ref 15–41)
Albumin: 3.9 g/dL (ref 3.5–5.0)
Bilirubin, Direct: <0.1 mg/dL (ref 0.0–0.2)
TOTAL PROTEIN: 7.1 g/dL (ref 6.5–8.1)
Total Bilirubin: 0.4 mg/dL (ref 0.3–1.2)

## 2018-01-31 LAB — TROPONIN I: Troponin I: <0.03 ng/mL (ref <0.03)

## 2018-01-31 LAB — BRAIN NATRIURETIC PEPTIDE: B Natriuretic Peptide: 33 pg/mL (ref 0.0–100.0)

## 2018-01-31 MED ORDER — NITROGLYCERIN 0.4 MG SL SUBL
0.4000 mg | SUBLINGUAL_TABLET | SUBLINGUAL | Status: DC | PRN
  Administered 2018-01-31: 0.4 mg via SUBLINGUAL
  Filled 2018-01-31: qty 1

## 2018-01-31 MED ORDER — NITROGLYCERIN 2 % TD OINT
1.0000 [in_us] | TOPICAL_OINTMENT | Freq: Once | TRANSDERMAL | Status: AC
  Administered 2018-01-31: 1 [in_us] via TOPICAL
  Filled 2018-01-31: qty 1

## 2018-01-31 MED ORDER — POTASSIUM CHLORIDE CRYS ER 20 MEQ PO TBCR
40.0000 meq | EXTENDED_RELEASE_TABLET | Freq: Once | ORAL | Status: AC
  Administered 2018-01-31: 40 meq via ORAL
  Filled 2018-01-31: qty 2

## 2018-01-31 MED ORDER — ASPIRIN 81 MG PO CHEW
324.0000 mg | CHEWABLE_TABLET | Freq: Once | ORAL | Status: AC
  Administered 2018-01-31: 324 mg via ORAL
  Filled 2018-01-31: qty 4

## 2018-01-31 NOTE — ED Provider Notes (Addendum)
[facility] Emergency Department Provider Note  ____________________________________________  Time seen: Approximately 7:09 PM  I have reviewed the triage vital signs and the nursing notes.   HISTORY  Chief Complaint Chest Pain    HPI Sierra Lewis is a 67 y.o. female a history of hypertension, hyperlipidemia and GERD presenting for chest pain.  The patient reports that for weeks, she has had a constant dull central chest pain which occasionally has a squeezing component to it.  It is present all day and all night, and there is nothing that she can do to make it better or worse.  It is not associated with exertion, position, or food.  She occasionally has shortness of breath, which is worse when she sits or lays down.  She denies any lower extremity swelling, calf pain, or personal history of blood clots.  The patient saw her primary care physician yesterday, who started her on clonidine for hypertension; she is here today because despite taking clonidine and improvement in her blood pressure, she continues to be symptomatic.  Last stress test was greater than 12 years ago.  FH: Mother with blood clots.  Past Medical History:  Diagnosis Date  . Anxiety   . GERD (gastroesophageal reflux disease)   . Hyperlipidemia   . Hypertension   . Insomnia     There are no active problems to display for this patient.   Past Surgical History:  Procedure Laterality Date  . ABDOMINAL HYSTERECTOMY    . BIOPSY BREAST      Current Outpatient Rx  . Order #: [id number] Class: Historical Med  . Order #: [id number] Class: Historical Med  . Order #: [id number] Class: Historical Med  . Order #: [id number] Class: Historical Med  . Order #: [id number] Class: Historical Med  . Order #: [id number] Class: Historical Med  . Order #: [id number] Class: Historical Med  . Order #: [id number] Class: Historical Med    Allergies Benadryl [diphenhydramine] and Penicillins  No family history on  file.  Social History Social History   Tobacco Use  . Smoking status: Never Smoker  . Smokeless tobacco: Never Used  Substance Use Topics  . Alcohol use: Yes    Comment: Wine minimal...every other week.  . Drug use: Not Currently    Review of Systems Constitutional: No fever/chills. Eyes: No visual changes. ENT: No sore throat. No congestion or rhinorrhea. Cardiovascular: Denies chest pain. Denies palpitations. Respiratory: Denies shortness of breath.  No cough. Gastrointestinal: No abdominal pain.  No nausea, no vomiting.  No diarrhea.  No constipation. Genitourinary: Negative for dysuria. Musculoskeletal: Negative for back pain. Skin: Negative for rash. Neurological: Negative for headaches. No focal numbness, tingling or weakness.     ____________________________________________   PHYSICAL EXAM:  VITAL SIGNS: ED Triage Vitals  Enc Vitals Group     BP 01/31/18 1743 (!) 153/86     Pulse Rate 01/31/18 1743 71     Resp --      Temp 01/31/18 1743 98.4 F (36.9 C)     Temp Source 01/31/18 1743 Oral     SpO2 01/31/18 1743 98 %     Weight 01/31/18 1744 186 lb (84.4 kg)     Height 01/31/18 1744 5\' 1"  (1.549 m)     Head Circumference --      Peak Flow --      Pain Score 01/31/18 1754 5     Pain Loc --      Pain Edu? --      Excl. in [place]? --  Constitutional: Alert and oriented. Answers questions appropriately. Eyes: Conjunctivae are normal.  EOMI. No scleral icterus. Head: Atraumatic. Nose: No congestion/rhinnorhea. Mouth/Throat: Mucous membranes are moist.  Neck: No stridor.  Supple.  No JVD.  No meningismus. Cardiovascular: Normal rate, regular rhythm. No murmurs, rubs or gallops.  Respiratory: Normal respiratory effort.  No accessory muscle use or retractions. Lungs CTAB.  No wheezes, rales or ronchi. Gastrointestinal: Overweight.  Soft, nontender and nondistended.  No guarding or rebound.  No peritoneal signs. Musculoskeletal: No LE edema. No ttp in the  calves or palpable cords.  Negative Homan's sign. Neurologic:  A&Ox3.  Speech is clear.  Face and smile are symmetric.  EOMI.  Moves all extremities well. Skin:  Skin is warm, dry and intact. No rash noted. Psychiatric: Mood and affect are normal. Speech and behavior are normal.  Normal judgement  ____________________________________________   LABS (all labs ordered are listed, but only abnormal results are displayed)  Labs Reviewed  BASIC METABOLIC PANEL - Abnormal; Notable for the following components:      Result Value   Potassium 3.2 (*)    Glucose, Bld 157 (*)    All other components within normal limits  CBC  TROPONIN I  TROPONIN I  HEPATIC FUNCTION PANEL  BRAIN NATRIURETIC PEPTIDE   ____________________________________________  EKG  ED ECG REPORT I, Anne-Caroline Mariea Clonts, the attending physician, personally viewed and interpreted this ECG.   Date: 01/31/2018  EKG Time: 1749  Rate: 60  Rhythm: normal sinus rhythm  Axis: leftward  Intervals:none  ST&T Change: No STEMI  ____________________________________________  RADIOLOGY  Dg Chest 2 View  Result Date: 01/31/2018 CLINICAL DATA:  Central chest pressure EXAM: CHEST - 2 VIEW COMPARISON:  05/28/2015 FINDINGS: The heart size and mediastinal contours are within normal limits. Moderate aortic atherosclerosis with slight uncoiling of the thoracic aorta similar to prior. Both lungs are clear. The visualized skeletal structures are unremarkable. IMPRESSION: No active cardiopulmonary disease.  Aortic atherosclerosis. Electronically Signed   By: Ashley Royalty M.D.   On: 01/31/2018 18:38    ____________________________________________   PROCEDURES  Procedure(s) performed: None  Procedures  Critical Care performed: No ____________________________________________   INITIAL IMPRESSION / ASSESSMENT AND PLAN / ED COURSE  Pertinent labs & imaging results that were available during my care of the patient were reviewed by  me and considered in my medical decision making (see chart for details).  67 y.o. hypertension and hyperlipidemia presenting with several weeks of constant unchanging chest pain, occasionally with shortness of breath.  Overall, the patient is mildly hypertensive at 153/86 but hemodynamically stable.  Her EKG is reassuring and shows no evidence of arrhythmia or ischemic changes.  Her chest x-ray shows no acute cardiopulmonary process and her troponin is negative.  Her blood counts are also reassuring and her electrolytes show a chronic unchanged hypokalemia; I have supplemented for her for her hypokalemia.  The patient does have multiple cardiac risk factors, and risk stratification's studies such as stress test is indicated, at least as an outpatient.  I have offered the patient inpatient versus outpatient evaluation, and she prefers to follow-up with cardiology as an outpatient.  GI causes including GERD, esophageal spasm, ulcer disease are considered but also would be atypical without any change with food or position.  PE and aortic pathology are considered but also very unlikely.  We will get a second troponin today, treat the patient with aspirin and nitroglycerin, and reevaluate her for final disposition.  ----------------------------------------- 7:53 PM on 01/31/2018 -----------------------------------------  The patient and her family wish to stay for continued evaluation and inpatient risk stratification.  The patient reports that her chest pain is "a touch" better so we will give her nitroglycerin paste at this time.   ____________________________________________  FINAL CLINICAL IMPRESSION(S) / ED DIAGNOSES  Final diagnoses:  Chest pain, unspecified type  Shortness of breath         NEW MEDICATIONS STARTED DURING THIS VISIT:  New Prescriptions   No medications on file      Eula Listen, MD 01/31/18 Docia Chuck    Eula Listen, MD 01/31/18 [phone]

## 2018-01-31 NOTE — ED Notes (Signed)
EMTALA documented on wrong pt.

## 2018-01-31 NOTE — ED Triage Notes (Signed)
Pt to the er for chest pressure in the center of chest. Pt was seen at [facility] yesterday and started on buspirone, losartan, clonidine. Pt says she is a nervous person and the buspirone is not helping.

## 2018-01-31 NOTE — ED Notes (Signed)
Pt given food to eat per request.

## 2018-02-01 ENCOUNTER — Encounter: Payer: Self-pay | Admitting: Surgery

## 2018-02-01 ENCOUNTER — Observation Stay: Payer: Medicare Other

## 2018-02-01 DIAGNOSIS — R079 Chest pain, unspecified: Secondary | ICD-10-CM | POA: Diagnosis present

## 2018-02-01 LAB — HEMOGLOBIN A1C
Hgb A1c MFr Bld: 6.4 % — ABNORMAL HIGH (ref 4.8–5.6)
Mean Plasma Glucose: 136.98 mg/dL

## 2018-02-01 LAB — NM MYOCAR MULTI W/SPECT W/WALL MOTION / EF
CHL CUP NUCLEAR SDS: 2
CHL CUP NUCLEAR SRS: 0
CHL CUP NUCLEAR SSS: 2
CSEPEW: 4.6 METS
CSEPPHR: 153 {beats}/min
Exercise duration (min): 3 min
Exercise duration (sec): 0 s
LV dias vol: 83 mL (ref 46–106)
LVSYSVOL: 28 mL
MPHR: 152 {beats}/min
NUC STRESS TID: 0.98
Percent HR: 100 %
Rest HR: 71 {beats}/min

## 2018-02-01 LAB — TSH: TSH: 2.906 u[IU]/mL (ref 0.350–4.500)

## 2018-02-01 LAB — TROPONIN I
Troponin I: <0.03 ng/mL (ref <0.03)
Troponin I: <0.03 ng/mL (ref <0.03)

## 2018-02-01 MED ORDER — LOSARTAN POTASSIUM-HCTZ 100-25 MG PO TABS: 1.0000 | ORAL_TABLET | Freq: Every day | ORAL | Status: DC

## 2018-02-01 MED ORDER — SIMVASTATIN 20 MG PO TABS
40.0000 mg | ORAL_TABLET | Freq: Every day | ORAL | Status: DC
  Administered 2018-02-01: 40 mg via ORAL
  Filled 2018-02-01: qty 2

## 2018-02-01 MED ORDER — ENOXAPARIN SODIUM 40 MG/0.4ML ~~LOC~~ SOLN
40.0000 mg | SUBCUTANEOUS | Status: DC
  Administered 2018-02-01: 40 mg via SUBCUTANEOUS
  Filled 2018-02-01: qty 0.4

## 2018-02-01 MED ORDER — INFLUENZA VAC SPLIT HIGH-DOSE 0.5 ML IM SUSY
0.5000 mL | PREFILLED_SYRINGE | INTRAMUSCULAR | Status: AC
  Administered 2018-02-01: 0.5 mL via INTRAMUSCULAR
  Filled 2018-02-01: qty 0.5

## 2018-02-01 MED ORDER — DOCUSATE SODIUM 100 MG PO CAPS
100.0000 mg | ORAL_CAPSULE | Freq: Two times a day (BID) | ORAL | Status: DC
  Administered 2018-02-01 (×2): 100 mg via ORAL
  Filled 2018-02-01 (×2): qty 1

## 2018-02-01 MED ORDER — BUSPIRONE HCL 10 MG PO TABS
10.0000 mg | ORAL_TABLET | Freq: Three times a day (TID) | ORAL | Status: DC | PRN
  Filled 2018-02-01: qty 1

## 2018-02-01 MED ORDER — CLONIDINE HCL 0.1 MG PO TABS: 0.1000 mg | ORAL_TABLET | Freq: Four times a day (QID) | ORAL | Status: DC | PRN

## 2018-02-01 MED ORDER — DIGOXIN 0.25 MG/ML IJ SOLN: 0.5000 mg | Freq: Once | INTRAMUSCULAR | Status: DC

## 2018-02-01 MED ORDER — PAROXETINE HCL 20 MG PO TABS
20.0000 mg | ORAL_TABLET | Freq: Every day | ORAL | Status: DC
  Administered 2018-02-01: 20 mg via ORAL
  Filled 2018-02-01: qty 1

## 2018-02-01 MED ORDER — ACETAMINOPHEN 650 MG RE SUPP: 650.0000 mg | Freq: Four times a day (QID) | RECTAL | Status: DC | PRN

## 2018-02-01 MED ORDER — PANTOPRAZOLE SODIUM 40 MG PO TBEC
40.0000 mg | DELAYED_RELEASE_TABLET | Freq: Every day | ORAL | Status: DC
  Administered 2018-02-01: 40 mg via ORAL
  Filled 2018-02-01: qty 1

## 2018-02-01 MED ORDER — METOPROLOL TARTRATE 50 MG PO TABS
50.0000 mg | ORAL_TABLET | Freq: Two times a day (BID) | ORAL | Status: DC
Start: 2018-02-01 — End: 2018-02-01
  Administered 2018-02-01 (×2): 50 mg via ORAL
  Filled 2018-02-01 (×2): qty 1

## 2018-02-01 MED ORDER — TECHNETIUM TC 99M TETROFOSMIN IV KIT
30.7620 | PACK | Freq: Once | INTRAVENOUS | Status: AC | PRN
  Administered 2018-02-01: 30.762 via INTRAVENOUS

## 2018-02-01 MED ORDER — POTASSIUM CHLORIDE CRYS ER 10 MEQ PO TBCR
10.0000 meq | EXTENDED_RELEASE_TABLET | Freq: Every day | ORAL | Status: DC
  Administered 2018-02-01: 10 meq via ORAL
  Filled 2018-02-01: qty 1

## 2018-02-01 MED ORDER — PNEUMOCOCCAL VAC POLYVALENT 25 MCG/0.5ML IJ INJ
0.5000 mL | INJECTION | INTRAMUSCULAR | Status: AC
  Administered 2018-02-01: 0.5 mL via INTRAMUSCULAR

## 2018-02-01 MED ORDER — FLUTICASONE PROPIONATE 50 MCG/ACT NA SUSP
2.0000 | Freq: Every day | NASAL | Status: DC | PRN
  Filled 2018-02-01: qty 16

## 2018-02-01 MED ORDER — TOPIRAMATE 25 MG PO TABS
25.0000 mg | ORAL_TABLET | Freq: Every day | ORAL | Status: DC
  Administered 2018-02-01: 25 mg via ORAL
  Filled 2018-02-01 (×2): qty 1

## 2018-02-01 MED ORDER — TECHNETIUM TC 99M TETROFOSMIN IV KIT
10.9730 | PACK | Freq: Once | INTRAVENOUS | Status: AC | PRN
  Administered 2018-02-01: 10.973 via INTRAVENOUS

## 2018-02-01 MED ORDER — HYDROCHLOROTHIAZIDE 25 MG PO TABS
25.0000 mg | ORAL_TABLET | Freq: Every day | ORAL | Status: DC
  Administered 2018-02-01: 25 mg via ORAL
  Filled 2018-02-01: qty 1

## 2018-02-01 MED ORDER — ONDANSETRON HCL 4 MG PO TABS: 4.0000 mg | ORAL_TABLET | Freq: Four times a day (QID) | ORAL | Status: DC | PRN

## 2018-02-01 MED ORDER — LOSARTAN POTASSIUM 50 MG PO TABS
100.0000 mg | ORAL_TABLET | Freq: Every day | ORAL | Status: DC
  Administered 2018-02-01: 100 mg via ORAL
  Filled 2018-02-01: qty 2

## 2018-02-01 MED ORDER — VITAMIN D (ERGOCALCIFEROL) 1.25 MG (50000 UNIT) PO CAPS: 50000.0000 [IU] | ORAL_CAPSULE | ORAL | Status: DC

## 2018-02-01 MED ORDER — ONDANSETRON HCL 4 MG/2ML IJ SOLN: 4.0000 mg | Freq: Four times a day (QID) | INTRAMUSCULAR | Status: DC | PRN

## 2018-02-01 MED ORDER — SIMVASTATIN 20 MG PO TABS: 40.0000 mg | ORAL_TABLET | Freq: Every day | ORAL | Status: DC

## 2018-02-01 MED ORDER — ACETAMINOPHEN 325 MG PO TABS: 650.0000 mg | ORAL_TABLET | Freq: Four times a day (QID) | ORAL | Status: DC | PRN

## 2018-02-01 NOTE — ED Notes (Signed)
Family at bedside. 

## 2018-02-01 NOTE — H&P (Signed)
Sierra Lewis is an 67 y.o. female.   Chief Complaint: Chest pain HPI: She with past medical history of hypertension and hyperlipidemia presents to the emergency department complaining of chest pain.  The patient reports that her central chest is felt heavy for the last 4 or 5 days.  She admits to mild relief of the chest pain when she rests.  The pain is associated with shortness of breath as well as some nausea but she denies vomiting or diaphoresis.  Upon arrival the patient's blood pressure was elevated but her heart enzymes found to be within normal limits.  Nitropaste was applied to her chest with minimal relief of her chest discomfort which prompted the emergency department staff to call the hospitalist service for admission.  Past Medical History:  Diagnosis Date  . Anxiety   . GERD (gastroesophageal reflux disease)   . Hyperlipidemia   . Hypertension   . Insomnia     Past Surgical History:  Procedure Laterality Date  . ABDOMINAL HYSTERECTOMY    . BIOPSY BREAST      No family history on file. HTN and heart disease throughout the family   Social History:  reports that she has never smoked. She has never used smokeless tobacco. She reports that she drinks alcohol. She reports that she has current or past drug history.  Allergies:  Allergies  Allergen Reactions  . Benadryl [Diphenhydramine] Swelling  . Penicillins Swelling    Has patient had a PCN reaction causing immediate rash, facial/tongue/throat swelling, SOB or lightheadedness with hypotension: Yes Has patient had a PCN reaction causing severe rash involving mucus membranes or skin necrosis: No Has patient had a PCN reaction that required hospitalization No Has patient had a PCN reaction occurring within the last 10 years: No If all of the above answers are "NO", then may proceed with Cephalosporin use.     (Not in a hospital admission)  Results for orders placed or performed during the hospital encounter of  01/31/18 (from the past 48 hour(s))  Basic metabolic panel     Status: Abnormal   Collection Time: 01/31/18  5:56 PM  Result Value Ref Range   Sodium 139 135 - 145 mmol/L   Potassium 3.2 (L) 3.5 - 5.1 mmol/L   Chloride 105 98 - 111 mmol/L   CO2 27 22 - 32 mmol/L   Glucose, Bld 157 (H) 70 - 99 mg/dL   BUN 14 8 - 23 mg/dL   Creatinine, Ser 0.83 0.44 - 1.00 mg/dL   Calcium 9.4 8.9 - 10.3 mg/dL   GFR calc non Af Amer >60 >60 mL/min   GFR calc Af Amer >60 >60 mL/min    Comment: (NOTE) The eGFR has been calculated using the CKD EPI equation. This calculation has not been validated in all clinical situations. eGFR's persistently <60 mL/min signify possible Chronic Kidney Disease.    Anion gap 7 5 - 15    Comment: Performed at [facility], [address] [place], [place] [address]  CBC     Status: None   Collection Time: 01/31/18  5:56 PM  Result Value Ref Range   WBC 4.8 4.0 - 10.5 K/uL   RBC 4.22 3.87 - 5.11 MIL/uL   Hemoglobin 12.4 12.0 - 15.0 g/dL   HCT 37.6 36.0 - 46.0 %   MCV 89.1 80.0 - 100.0 fL   MCH 29.4 26.0 - 34.0 pg   MCHC 33.0 30.0 - 36.0 g/dL   RDW 13.7 11.5 - 15.5 %  Platelets 307 150 - 400 K/uL   nRBC 0.0 0.0 - 0.2 %    Comment: Performed at [facility], [address] [place], [place] [address]  Troponin I     Status: None   Collection Time: 01/31/18  5:56 PM  Result Value Ref Range   Troponin I <0.03 <0.03 ng/mL    Comment: Performed at [facility], [address] [place],  [address]  Troponin I     Status: None   Collection Time: 01/31/18  7:25 PM  Result Value Ref Range   Troponin I <0.03 <0.03 ng/mL    Comment: Performed at [facility], [address] [place], [place] [address]  Hepatic function panel     Status: None   Collection Time: 01/31/18  7:25 PM  Result Value Ref Range   Total Protein 7.1 6.5 - 8.1 g/dL   Albumin 3.9 3.5 - 5.0 g/dL   AST 22 15 - 41 U/L   ALT 17 0 - 44 U/L   Alkaline  Phosphatase 90 38 - 126 U/L   Total Bilirubin 0.4 0.3 - 1.2 mg/dL   Bilirubin, Direct <0.1 0.0 - 0.2 mg/dL   Indirect Bilirubin NOT CALCULATED 0.3 - 0.9 mg/dL    Comment: Performed at [facility], [address] [place], [place] [address]  Brain natriuretic peptide     Status: None   Collection Time: 01/31/18  7:25 PM  Result Value Ref Range   B Natriuretic Peptide 33.0 0.0 - 100.0 pg/mL    Comment: Performed at [facility], [address] [place], [place] [address]   Dg Chest 2 View  Result Date: 01/31/2018 CLINICAL DATA:  Central chest pressure EXAM: CHEST - 2 VIEW COMPARISON:  05/28/2015 FINDINGS: The heart size and mediastinal contours are within normal limits. Moderate aortic atherosclerosis with slight uncoiling of the thoracic aorta similar to prior. Both lungs are clear. The visualized skeletal structures are unremarkable. IMPRESSION: No active cardiopulmonary disease.  Aortic atherosclerosis. Electronically Signed   By: Ashley Royalty M.D.   On: 01/31/2018 18:38    Review of Systems  Constitutional: Negative for chills and fever.  HENT: Negative for sore throat and tinnitus.   Eyes: Negative for blurred vision and redness.  Respiratory: Positive for shortness of breath. Negative for cough.   Cardiovascular: Positive for chest pain. Negative for palpitations, orthopnea and PND.  Gastrointestinal: Positive for nausea. Negative for abdominal pain, diarrhea and vomiting.  Genitourinary: Negative for dysuria, frequency and urgency.  Musculoskeletal: Negative for joint pain and myalgias.  Skin: Negative for rash.       No lesions  Neurological: Negative for speech change, focal weakness and weakness.  Endo/Heme/Allergies: Does not bruise/bleed easily.       No temperature intolerance  Psychiatric/Behavioral: Negative for depression and suicidal ideas.    Blood pressure 126/73, pulse 67, temperature 98.4 F (36.9 C), resp. rate 15, height '5\' 1"'$  (1.549 m),  weight 84.4 kg, SpO2 93 %. Physical Exam  Vitals reviewed. Constitutional: She is oriented to person, place, and time. She appears well-developed and well-nourished. No distress.  HENT:  Head: Normocephalic and atraumatic.  Mouth/Throat: Oropharynx is clear and moist.  Eyes: Pupils are equal, round, and reactive to light. Conjunctivae and EOM are normal. No scleral icterus.  Neck: Normal range of motion. Neck supple. No JVD present. No tracheal deviation present. No thyromegaly present.  Cardiovascular: Normal rate, regular rhythm and normal heart sounds. Exam reveals no gallop and  no friction rub.  No murmur heard. Respiratory: Effort normal and breath sounds normal.  GI: Soft. Bowel sounds are normal. She exhibits no distension. There is no tenderness.  Genitourinary:  Genitourinary Comments: Deferred  Musculoskeletal: Normal range of motion. She exhibits no edema.  Lymphadenopathy:    She has no cervical adenopathy.  Neurological: She is alert and oriented to person, place, and time. No cranial nerve deficit. She exhibits normal muscle tone.  Skin: Skin is warm and dry. No rash noted. No erythema.  Psychiatric: She has a normal mood and affect. Her behavior is normal. Judgment and thought content normal.     Assessment/Plan This is a 67 year old female admitted for chest pain. 1.  Chest pain: Continue to follow cardiac biomarkers.  Consult cardiology.  Monitor telemetry. 2.  Hypertension: Uncontrolled; continue clonidine, metoprolol, hydrochlorothiazide and losartan.  Consider adding calcium channel blocker. 3.  Hyperlipidemia: Continue statin therapy 4.  Hypokalemia: Replete potassium 5.  Depression: Continue BuSpar, Paxil and Topamax 6.  DVT prophylaxis: Lovenox 7.  GI prophylaxis: Pantoprazole per home regimen The patient is a full code.  Time spent on admission orders and patient care approximately 45 minutes  Harrie Foreman, MD 02/01/2018, 2:45 AM

## 2018-02-01 NOTE — Plan of Care (Signed)
Problem: Skin Integrity: Goal: Risk for impaired skin integrity will decrease 02/01/2018 0528 by Jannifer Rodney A, RN Outcome: Progressing 02/01/2018 0528 by Jannifer Rodney A, RN Outcome: Progressing   Problem: Safety: Goal: Ability to remain free from injury will improve 02/01/2018 0528 by Jannifer Rodney A, RN Outcome: Progressing 02/01/2018 0528 by Jannifer Rodney A, RN Outcome: Progressing   Problem: Pain Managment: Goal: General experience of comfort will improve 02/01/2018 0528 by Jannifer Rodney A, RN Outcome: Progressing 02/01/2018 0528 by Jannifer Rodney A, RN Outcome: Progressing   Problem: Elimination: Goal: Will not experience complications related to urinary retention 02/01/2018 0528 by Jannifer Rodney A, RN Outcome: Progressing 02/01/2018 0528 by Jannifer Rodney A, RN Outcome: Progressing   Problem: Elimination: Goal: Will not experience complications related to bowel motility 02/01/2018 0528 by Jannifer Rodney A, RN Outcome: Progressing 02/01/2018 0528 by Jannifer Rodney A, RN Outcome: Progressing   Problem: Coping: Goal: Level of anxiety will decrease 02/01/2018 0528 by Jannifer Rodney A, RN Outcome: Progressing 02/01/2018 0528 by Jannifer Rodney A, RN Outcome: Progressing   Problem: Nutrition: Goal: Adequate nutrition will be maintained 02/01/2018 0528 by Jannifer Rodney A, RN Outcome: Progressing 02/01/2018 0528 by Jannifer Rodney A, RN Outcome: Progressing   Problem: Activity: Goal: Risk for activity intolerance will decrease 02/01/2018 0528 by Jannifer Rodney A, RN Outcome: Progressing 02/01/2018 0528 by Jannifer Rodney A, RN Outcome: Progressing   Problem: Clinical Measurements: Goal: Cardiovascular complication will be avoided 02/01/2018 0528 by Jannifer Rodney A, RN Outcome: Progressing 02/01/2018 0528 by Jannifer Rodney A, RN Outcome: Progressing   Problem: Clinical Measurements: Goal: Respiratory complications will improve 02/01/2018 0528 by Jannifer Rodney A,  RN Outcome: Progressing 02/01/2018 0528 by Jannifer Rodney A, RN Outcome: Progressing   Problem: Clinical Measurements: Goal: Diagnostic test results will improve 02/01/2018 0528 by Jannifer Rodney A, RN Outcome: Progressing 02/01/2018 0528 by Jannifer Rodney A, RN Outcome: Progressing   Problem: Clinical Measurements: Goal: Will remain free from infection 02/01/2018 0528 by Jannifer Rodney A, RN Outcome: Progressing 02/01/2018 0528 by Jannifer Rodney A, RN Outcome: Progressing   Problem: Clinical Measurements: Goal: Ability to maintain clinical measurements within normal limits will improve 02/01/2018 0528 by Jannifer Rodney A, RN Outcome: Progressing 02/01/2018 0528 by Jannifer Rodney A, RN Outcome: Progressing   Problem: Health Behavior/Discharge Planning: Goal: Ability to manage health-related needs will improve 02/01/2018 0528 by Jannifer Rodney A, RN Outcome: Progressing 02/01/2018 0528 by Jannifer Rodney A, RN Outcome: Progressing   Problem: Education: Goal: Knowledge of General Education information will improve Description Including pain rating scale, medication(s)/side effects and non-pharmacologic comfort measures 02/01/2018 0528 by Jannifer Rodney A, RN Outcome: Progressing 02/01/2018 0528 by Jannifer Rodney A, RN Outcome: Progressing   Problem: Skin Integrity: Goal: Risk for impaired skin integrity will decrease 02/01/2018 0528 by Jannifer Rodney A, RN Outcome: Progressing 02/01/2018 0528 by Jannifer Rodney A, RN Outcome: Progressing   Problem: Safety: Goal: Ability to remain free from injury will improve 02/01/2018 0528 by Jannifer Rodney A, RN Outcome: Progressing 02/01/2018 0528 by Jannifer Rodney A, RN Outcome: Progressing   Problem: Pain Managment: Goal: General experience of comfort will improve 02/01/2018 0528 by Jannifer Rodney A, RN Outcome: Progressing 02/01/2018 0528 by Jannifer Rodney A, RN Outcome: Progressing   Problem: Elimination: Goal: Will not experience  complications related to urinary retention 02/01/2018 0528 by Jannifer Rodney A, RN Outcome: Progressing 02/01/2018 0528 by Jannifer Rodney A, RN Outcome: Progressing   Problem: Elimination: Goal: Will not experience complications related to bowel motility 02/01/2018 0528 by  Teige Rountree A, RN Outcome: Progressing 02/01/2018 0528 by Vergie Living, RN Outcome: Progressing   Problem: Education: Goal: Knowledge of General Education information will improve Description Including pain rating scale, medication(s)/side effects and non-pharmacologic comfort measures 02/01/2018 0528 by Jannifer Rodney A, RN Outcome: Progressing 02/01/2018 0528 by Jannifer Rodney A, RN Outcome: Progressing   Problem: Health Behavior/Discharge Planning: Goal: Ability to manage health-related needs will improve 02/01/2018 0528 by Jannifer Rodney A, RN Outcome: Progressing 02/01/2018 0528 by Jannifer Rodney A, RN Outcome: Progressing   Problem: Clinical Measurements: Goal: Ability to maintain clinical measurements within normal limits will improve 02/01/2018 0528 by Jannifer Rodney A, RN Outcome: Progressing 02/01/2018 0528 by Jannifer Rodney A, RN Outcome: Progressing   Problem: Clinical Measurements: Goal: Will remain free from infection 02/01/2018 0528 by Jannifer Rodney A, RN Outcome: Progressing 02/01/2018 0528 by Jannifer Rodney A, RN Outcome: Progressing   Problem: Clinical Measurements: Goal: Diagnostic test results will improve 02/01/2018 0528 by Jannifer Rodney A, RN Outcome: Progressing 02/01/2018 0528 by Vergie Living, RN Outcome: Progressing

## 2018-02-01 NOTE — ED Notes (Signed)
Patient is resting comfortably. 

## 2018-02-01 NOTE — Progress Notes (Signed)
Patient transferred from the ED via stretcher to room 234. Oriented patient to room and room equipment. Explained Fall Risk protocol and to call for assistance when needed. Currently patient complains of no pain or discomfort. Family at the bedside. Will continue to monitor patient to end of shift.

## 2018-02-01 NOTE — Plan of Care (Addendum)
Patient discharged after negative stress test.   She continues to report chest pressure.  Hospitalist Sainani encouraged her to take her PPI and BP meds as prescribed and instructed her to follow up with a GI physician for possible EGD if symptoms persist.  Pt's VSS on room air.  DC instructions given.  No new scrips.  She requested flu and pneumo vaccines, ordered and administered by writing RN.  She will leave hospital in car with her daughter

## 2018-02-01 NOTE — ED Notes (Signed)
ED TO INPATIENT HANDOFF REPORT  Name/Age/Gender Sierra Lewis 67 y.o. female  Code Status   Home/SNF/Other Home  Chief Complaint chest pressure  Level of Care/Admitting Diagnosis ED Disposition    ED Disposition Condition [place]: [place] [[id number]]  Level of Care: Telemetry [5]  Diagnosis: Chest pain [865784]  Admitting Physician: Harrie Foreman [[id number]]  Attending Physician: Harrie Foreman [[id number]]  PT Class (Do Not Modify): Observation [104]  PT Acc Code (Do Not Modify): Observation [10022]       Medical History Past Medical History:  Diagnosis Date  . Anxiety   . GERD (gastroesophageal reflux disease)   . Hyperlipidemia   . Hypertension   . Insomnia     Allergies Allergies  Allergen Reactions  . Benadryl [Diphenhydramine] Swelling  . Penicillins Swelling    Has patient had a PCN reaction causing immediate rash, facial/tongue/throat swelling, SOB or lightheadedness with hypotension: Yes Has patient had a PCN reaction causing severe rash involving mucus membranes or skin necrosis: No Has patient had a PCN reaction that required hospitalization No Has patient had a PCN reaction occurring within the last 10 years: No If all of the above answers are "NO", then may proceed with Cephalosporin use.    IV Location/Drains/Wounds Patient Lines/Drains/Airways Status   Active Line/Drains/Airways    Name:   Placement date:   Placement time:   Site:   Days:   Peripheral IV 05/28/15 Left Antecubital   05/28/15    0535    Antecubital   980   Peripheral IV 01/31/18 Right Antecubital   01/31/18    2005    Antecubital   1          Labs/Imaging Results for orders placed or performed during the hospital encounter of 01/31/18 (from the past 48 hour(s))  Basic metabolic panel     Status: Abnormal   Collection Time: 01/31/18  5:56 PM  Result Value Ref Range   Sodium 139 135 - 145 mmol/L   Potassium 3.2 (L) 3.5 -  5.1 mmol/L   Chloride 105 98 - 111 mmol/L   CO2 27 22 - 32 mmol/L   Glucose, Bld 157 (H) 70 - 99 mg/dL   BUN 14 8 - 23 mg/dL   Creatinine, Ser 0.83 0.44 - 1.00 mg/dL   Calcium 9.4 8.9 - 10.3 mg/dL   GFR calc non Af Amer >60 >60 mL/min   GFR calc Af Amer >60 >60 mL/min    Comment: (NOTE) The eGFR has been calculated using the CKD EPI equation. This calculation has not been validated in all clinical situations. eGFR's persistently <60 mL/min signify possible Chronic Kidney Disease.    Anion gap 7 5 - 15    Comment: Performed at [facility], [address] [place], [place] [address]  CBC     Status: None   Collection Time: 01/31/18  5:56 PM  Result Value Ref Range   WBC 4.8 4.0 - 10.5 K/uL   RBC 4.22 3.87 - 5.11 MIL/uL   Hemoglobin 12.4 12.0 - 15.0 g/dL   HCT 37.6 36.0 - 46.0 %   MCV 89.1 80.0 - 100.0 fL   MCH 29.4 26.0 - 34.0 pg   MCHC 33.0 30.0 - 36.0 g/dL   RDW 13.7 11.5 - 15.5 %   Platelets 307 150 - 400 K/uL   nRBC 0.0 0.0 - 0.2 %    Comment: Performed at [facility], [place]  [address] [place], [place] [address]  Troponin I     Status: None   Collection Time: 01/31/18  5:56 PM  Result Value Ref Range   Troponin I <0.03 <0.03 ng/mL    Comment: Performed at [facility], [address] [place], [place] [address]  Troponin I     Status: None   Collection Time: 01/31/18  7:25 PM  Result Value Ref Range   Troponin I <0.03 <0.03 ng/mL    Comment: Performed at [facility], [address] [place], [place] [address]  Hepatic function panel     Status: None   Collection Time: 01/31/18  7:25 PM  Result Value Ref Range   Total Protein 7.1 6.5 - 8.1 g/dL   Albumin 3.9 3.5 - 5.0 g/dL   AST 22 15 - 41 U/L   ALT 17 0 - 44 U/L   Alkaline Phosphatase 90 38 - 126 U/L   Total Bilirubin 0.4 0.3 - 1.2 mg/dL   Bilirubin, Direct <0.1 0.0 - 0.2 mg/dL   Indirect Bilirubin NOT CALCULATED 0.3 - 0.9 mg/dL    Comment: Performed at [facility], [address] [place], [place] [address]  Brain natriuretic peptide     Status: None   Collection Time: 01/31/18  7:25 PM  Result Value Ref Range   B Natriuretic Peptide 33.0 0.0 - 100.0 pg/mL    Comment: Performed at [facility], [address] [place], [place] [address]   Dg Chest 2 View  Result Date: 01/31/2018 CLINICAL DATA:  Central chest pressure EXAM: CHEST - 2 VIEW COMPARISON:  05/28/2015 FINDINGS: The heart size and mediastinal contours are within normal limits. Moderate aortic atherosclerosis with slight uncoiling of the thoracic aorta similar to prior. Both lungs are clear. The visualized skeletal structures are unremarkable. IMPRESSION: No active cardiopulmonary disease.  Aortic atherosclerosis. Electronically Signed   By: Ashley Royalty M.D.   On: 01/31/2018 18:38    Pending Labs [employer] (From admission, onward)    Start     Ordered   Signed and Held  Creatinine, serum  (enoxaparin (LOVENOX)    CrCl >/= 30 ml/min)  Weekly,   R    Comments:  while on enoxaparin therapy    Signed and Held   Signed and Held  TSH  Add-on,   R     Signed and Held   Signed and Held  Hemoglobin A1c  Add-on,   R     Signed and Held   Signed and Held  Troponin I  Now then every 6 hours,   R     Signed and Held          Vitals/Pain Today's Vitals   02/01/18 0100 02/01/18 0130 02/01/18 0200 02/01/18 0230  BP: 139/74 126/73 (!) 146/79 (!) 147/80  Pulse: 71 67 76 72  Resp: _0 Temp:      TempSrc:      SpO2: 95% 93% 94% 94%  Weight:      Height:      PainSc:        Isolation Precautions No active isolations  Medications Medications  nitroGLYCERIN (NITROSTAT) SL tablet 0.4 mg (0.4 mg Sublingual Given 01/31/18 1923)  potassium chloride SA (K-DUR,KLOR-CON) CR tablet 40 mEq (40 mEq Oral Given 01/31/18 1923)  aspirin chewable tablet 324 mg (324 mg Oral Given 01/31/18 1923)  nitroGLYCERIN (NITROGLYN) 2 % ointment 1 inch (1 inch Topical Given 01/31/18 2000)  Mobility walks with device

## 2018-02-01 NOTE — Care Management Note (Signed)
Case Management Note  Patient Details  Name: Sierra Lewis MRN: [id number] Date of Birth: 1950/08/03  Subjective/Objective:       Independent in all adls, denies issues accessing medical care, obtaining medications or with transportation.  Current with PCP.  No discharge needs identified at present by care manager or members of care team               Action/Plan:   Expected Discharge Date:  02/01/18               Expected Discharge Plan:  Home/Self Care  In-House Referral:     Discharge planning Services  CM Consult  Post Acute Care Choice:    Choice offered to:     DME Arranged:    DME Agency:     HH Arranged:    [place] Agency:     Status of Service:  Completed, signed off  If discussed at [employer] of Stay Meetings, dates discussed:    Additional Comments:  Elza Rafter, RN 02/01/2018, 3:01 PM

## 2018-02-01 NOTE — Care Management Obs Status (Signed)
MEDICARE OBSERVATION STATUS NOTIFICATION   Patient Details  Name: Sierra Lewis MRN: [id number] Date of Birth: 30-Aug-1950   Medicare Observation Status Notification Given:  Yes    Elza Rafter, RN 02/01/2018, 2:58 PM

## 2018-02-01 NOTE — Discharge Summary (Signed)
[facility] [place] NAME: Sierra Lewis    MR#:  [id number]  DATE OF BIRTH:  08-04-1950  DATE OF ADMISSION:  01/31/2018 ADMITTING PHYSICIAN: Harrie Foreman, MD  DATE OF DISCHARGE: 02/01/2018  3:58 PM  PRIMARY CARE PHYSICIAN: Martin Majestic, FNP    ADMISSION DIAGNOSIS:  Shortness of breath [R06.02] Hypokalemia [E87.6] Chest pain, unspecified type [R07.9]  DISCHARGE DIAGNOSIS:  Active Problems:   Chest pain   SECONDARY DIAGNOSIS:   Past Medical History:  Diagnosis Date  . Anxiety   . GERD (gastroesophageal reflux disease)   . Hyperlipidemia   . Hypertension   . Insomnia     HOSPITAL COURSE:   67 year old female with past medical history of anxiety, GERD, hypertension, hyperlipidemia, insomnia who presented to the hospital due to chest pain.  1.  Chest pain-given patient's typical symptoms and risk factors she was observed overnight in the hospital.  She had 3 sets of cardiac markers checked which were negative. -Patient underwent a exercise treadmill stress test which showed no acute evidence of ischemia or ST or T wave changes. - She continues to have some mild chest pain which is likely either GI related or musculoskeletal.  She will follow-up with her primary care physician.  She is being discharged home.  2.  Essential hypertension-patient will continue her clonidine, losartan HCTZ, Metoprolol.   3.  Hyperlipidemia-continue simvastatin.  4.  Anxiety-patient will continue her Paxil, BuSpar.  DISCHARGE CONDITIONS:   Stable.   CONSULTS OBTAINED:  Treatment Team:  Teodoro Spray, MD  DRUG ALLERGIES:   Allergies  Allergen Reactions  . Benadryl [Diphenhydramine] Swelling  . Penicillins Swelling    Has patient had a PCN reaction causing immediate rash, facial/tongue/throat swelling, SOB or lightheadedness with hypotension: Yes Has patient had a PCN reaction causing severe rash involving mucus membranes or  skin necrosis: No Has patient had a PCN reaction that required hospitalization No Has patient had a PCN reaction occurring within the last 10 years: No If all of the above answers are "NO", then may proceed with Cephalosporin use.    DISCHARGE MEDICATIONS:   Allergies as of 02/01/2018      Reactions   Benadryl [diphenhydramine] Swelling   Penicillins Swelling   Has patient had a PCN reaction causing immediate rash, facial/tongue/throat swelling, SOB or lightheadedness with hypotension: Yes Has patient had a PCN reaction causing severe rash involving mucus membranes or skin necrosis: No Has patient had a PCN reaction that required hospitalization No Has patient had a PCN reaction occurring within the last 10 years: No If all of the above answers are "NO", then may proceed with Cephalosporin use.      Medication List    TAKE these medications   busPIRone 10 MG tablet Commonly known as:  BUSPAR Take 10 mg by mouth 3 (three) times daily as needed (for anxiety).   cloNIDine 0.1 MG tablet Commonly known as:  CATAPRES Take 0.1 mg by mouth every 6 (six) hours as needed (for tachycardia). May only take 2 per day   fluticasone 50 MCG/ACT nasal spray Commonly known as:  FLONASE Place 2 sprays into both nostrils daily as needed for allergies.   losartan-hydrochlorothiazide 100-25 MG tablet Commonly known as:  HYZAAR Take 1 tablet by mouth daily.   metoprolol tartrate 50 MG tablet Commonly known as:  LOPRESSOR Take 50 mg by mouth 2 (two) times daily.   omeprazole 20 MG capsule Commonly known as:  PRILOSEC  Take 20 mg by mouth daily as needed (for acid reflux).   PARoxetine 20 MG tablet Commonly known as:  PAXIL Take 20 mg by mouth daily.   potassium chloride 10 MEQ tablet Commonly known as:  K-DUR Take 10 mEq by mouth daily.   simvastatin 40 MG tablet Commonly known as:  ZOCOR Take 40 mg by mouth at bedtime.   topiramate 25 MG tablet Commonly known as:  TOPAMAX Take 25 mg  by mouth at bedtime.   Vitamin D (Ergocalciferol) 50000 units Caps capsule Commonly known as:  DRISDOL Take 50,000 Units by mouth every Thursday.         DISCHARGE INSTRUCTIONS:   DIET:  Cardiac diet  DISCHARGE CONDITION:  Stable  ACTIVITY:  Activity as tolerated  OXYGEN:  Home Oxygen: No.   Oxygen Delivery: room air  DISCHARGE LOCATION:  home   If you experience worsening of your admission symptoms, develop shortness of breath, life threatening emergency, suicidal or homicidal thoughts you must seek medical attention immediately by calling 911 or calling your MD immediately  if symptoms less severe.  You Must read complete instructions/literature along with all the possible adverse reactions/side effects for all the Medicines you take and that have been prescribed to you. Take any new Medicines after you have completely understood and accpet all the possible adverse reactions/side effects.   Please note  You were cared for by a hospitalist during your hospital stay. If you have any questions about your discharge medications or the care you received while you were in the hospital after you are discharged, you can call the unit and asked to speak with the hospitalist on call if the hospitalist that took care of you is not available. Once you are discharged, your primary care physician will handle any further medical issues. Please note that NO REFILLS for any discharge medications will be authorized once you are discharged, as it is imperative that you return to your primary care physician (or establish a relationship with a primary care physician if you do not have one) for your aftercare needs so that they can reassess your need for medications and monitor your lab values.     Today   Still having some mild chest pain but improved since this morning.  Stress test is negative, cardiac markers x3 have been negative.  Will discharge home today.  VITAL SIGNS:  Blood  pressure (!) 141/83, pulse 76, temperature 97.8 F (36.6 C), temperature source Oral, resp. rate 20, height 5\' 1"  (1.549 m), weight 84.5 kg, SpO2 93 %.  I/O:    Intake/Output Summary (Last 24 hours) at 02/01/2018 1725 Last data filed at 02/01/2018 1100 Gross per 24 hour  Intake 240 ml  Output 350 ml  Net -110 ml    PHYSICAL EXAMINATION:  GENERAL:  67 y.o.-year-old patient lying in the bed with no acute distress.  EYES: Pupils equal, round, reactive to light and accommodation. No scleral icterus. Extraocular muscles intact.  HEENT: Head atraumatic, normocephalic. Oropharynx and nasopharynx clear.  NECK:  Supple, no jugular venous distention. No thyroid enlargement, no tenderness.  LUNGS: Normal breath sounds bilaterally, no wheezing, rales,rhonchi. No use of accessory muscles of respiration.  CARDIOVASCULAR: S1, S2 normal. No murmurs, rubs, or gallops.  ABDOMEN: Soft, non-tender, non-distended. Bowel sounds present. No organomegaly or mass.  EXTREMITIES: No pedal edema, cyanosis, or clubbing.  NEUROLOGIC: Cranial nerves II through XII are intact. No focal motor or sensory defecits b/l.  PSYCHIATRIC: The patient is alert and  oriented x 3.  SKIN: No obvious rash, lesion, or ulcer.   DATA REVIEW:   CBC Recent Labs  Lab 01/31/18 1756  WBC 4.8  HGB 12.4  HCT 37.6  PLT 307    Chemistries  Recent Labs  Lab 01/31/18 1756 01/31/18 1925  NA 139  --   K 3.2*  --   CL 105  --   CO2 27  --   GLUCOSE 157*  --   BUN 14  --   CREATININE 0.83  --   CALCIUM 9.4  --   AST  --  22  ALT  --  17  ALKPHOS  --  90  BILITOT  --  0.4    Cardiac Enzymes Recent Labs  Lab 02/01/18 0923  TROPONINI <0.03      RADIOLOGY:  Dg Chest 2 View  Result Date: 01/31/2018 CLINICAL DATA:  Central chest pressure EXAM: CHEST - 2 VIEW COMPARISON:  05/28/2015 FINDINGS: The heart size and mediastinal contours are within normal limits. Moderate aortic atherosclerosis with slight uncoiling of the  thoracic aorta similar to prior. Both lungs are clear. The visualized skeletal structures are unremarkable. IMPRESSION: No active cardiopulmonary disease.  Aortic atherosclerosis. Electronically Signed   By: Ashley Royalty M.D.   On: 01/31/2018 18:38   Nm Myocar Multi W/spect W/wall Motion / Ef  Result Date: 02/01/2018  Blood pressure demonstrated a normal response to exercise.  There was no ST segment deviation noted during stress.  The study is normal.  This is a low risk study.  The left ventricular ejection fraction is normal (55-65%).  Negative ett with fair exercise tolerance EF normal Low risk study with no reversible ischemia.      Management plans discussed with the patient, family and they are in agreement.  CODE STATUS:     Code Status Orders  (From admission, onward)         Start     Ordered   02/01/18 0326  Full code  Continuous     02/01/18 0325        Code Status History    This patient has a current code status but no historical code status.      TOTAL TIME TAKING CARE OF THIS PATIENT: 40 minutes.    Henreitta Leber M.D on 02/01/2018 at 5:25 PM  Between 7am to 6pm - Pager - [phone]  After 6pm go to [email or web address] - [employer] [facility]  Office  [phone]  CC: Primary care physician; Martin Majestic, FNP

## 2018-03-14 DIAGNOSIS — R0789 Other chest pain: Secondary | ICD-10-CM

## 2018-03-14 HISTORY — DX: Other chest pain: R07.89

## 2018-05-31 DIAGNOSIS — I10 Essential (primary) hypertension: Secondary | ICD-10-CM | POA: Insufficient documentation

## 2018-05-31 DIAGNOSIS — E785 Hyperlipidemia, unspecified: Secondary | ICD-10-CM | POA: Insufficient documentation

## 2018-06-02 ENCOUNTER — Ambulatory Visit
Admission: RE | Admit: 2018-06-02 | Discharge: 2018-06-02 | Disposition: A | Discharge status: Home / Self Care | Payer: Medicare Other | Attending: Unknown Physician Specialty | Admitting: Unknown Physician Specialty

## 2018-06-02 ENCOUNTER — Ambulatory Visit: Payer: Medicare Other | Admitting: Anesthesiology

## 2018-06-02 ENCOUNTER — Encounter
Admission: RE | Disposition: A | Discharge status: Home / Self Care | Payer: Self-pay | Source: Home / Self Care | Attending: Unknown Physician Specialty

## 2018-06-02 DIAGNOSIS — I10 Essential (primary) hypertension: Secondary | ICD-10-CM | POA: Insufficient documentation

## 2018-06-02 DIAGNOSIS — F419 Anxiety disorder, unspecified: Secondary | ICD-10-CM | POA: Insufficient documentation

## 2018-06-02 DIAGNOSIS — K228 Other specified diseases of esophagus: Secondary | ICD-10-CM | POA: Diagnosis not present

## 2018-06-02 DIAGNOSIS — G4700 Insomnia, unspecified: Secondary | ICD-10-CM | POA: Insufficient documentation

## 2018-06-02 DIAGNOSIS — K2960 Other gastritis without bleeding: Secondary | ICD-10-CM | POA: Diagnosis not present

## 2018-06-02 DIAGNOSIS — E785 Hyperlipidemia, unspecified: Secondary | ICD-10-CM | POA: Diagnosis not present

## 2018-06-02 DIAGNOSIS — D122 Benign neoplasm of ascending colon: Secondary | ICD-10-CM | POA: Diagnosis not present

## 2018-06-02 DIAGNOSIS — Z79899 Other long term (current) drug therapy: Secondary | ICD-10-CM | POA: Insufficient documentation

## 2018-06-02 DIAGNOSIS — K5730 Diverticulosis of large intestine without perforation or abscess without bleeding: Secondary | ICD-10-CM | POA: Diagnosis not present

## 2018-06-02 DIAGNOSIS — K219 Gastro-esophageal reflux disease without esophagitis: Secondary | ICD-10-CM | POA: Diagnosis present

## 2018-06-02 DIAGNOSIS — K640 First degree hemorrhoids: Secondary | ICD-10-CM | POA: Insufficient documentation

## 2018-06-02 DIAGNOSIS — K210 Gastro-esophageal reflux disease with esophagitis: Secondary | ICD-10-CM | POA: Insufficient documentation

## 2018-06-02 DIAGNOSIS — Z1211 Encounter for screening for malignant neoplasm of colon: Secondary | ICD-10-CM | POA: Insufficient documentation

## 2018-06-02 DIAGNOSIS — Z7951 Long term (current) use of inhaled steroids: Secondary | ICD-10-CM | POA: Insufficient documentation

## 2018-06-02 DIAGNOSIS — Q438 Other specified congenital malformations of intestine: Secondary | ICD-10-CM | POA: Diagnosis not present

## 2018-06-02 HISTORY — DX: Other chest pain: R07.89

## 2018-06-02 HISTORY — PX: ESOPHAGOGASTRODUODENOSCOPY: SHX5428

## 2018-06-02 HISTORY — PX: COLONOSCOPY WITH PROPOFOL: SHX5780

## 2018-06-02 SURGERY — EGD (ESOPHAGOGASTRODUODENOSCOPY)
Anesthesia: General

## 2018-06-02 MED ORDER — MIDAZOLAM HCL 2 MG/2ML IJ SOLN
INTRAMUSCULAR | Status: AC
  Filled 2018-06-02: qty 2

## 2018-06-02 MED ORDER — FENTANYL CITRATE (PF) 100 MCG/2ML IJ SOLN
INTRAMUSCULAR | Status: DC | PRN
  Administered 2018-06-02 (×2): 50 ug via INTRAVENOUS

## 2018-06-02 MED ORDER — GLYCOPYRROLATE 0.2 MG/ML IJ SOLN
INTRAMUSCULAR | Status: DC | PRN
  Administered 2018-06-02: 0.2 mg via INTRAVENOUS

## 2018-06-02 MED ORDER — PROPOFOL 500 MG/50ML IV EMUL
INTRAVENOUS | Status: AC
  Filled 2018-06-02: qty 50

## 2018-06-02 MED ORDER — PROPOFOL 500 MG/50ML IV EMUL
INTRAVENOUS | Status: DC | PRN
  Administered 2018-06-02: 50 ug/kg/min via INTRAVENOUS

## 2018-06-02 MED ORDER — MIDAZOLAM HCL 5 MG/5ML IJ SOLN
INTRAMUSCULAR | Status: DC | PRN
  Administered 2018-06-02: 2 mg via INTRAVENOUS

## 2018-06-02 MED ORDER — LIDOCAINE HCL (PF) 2 % IJ SOLN
INTRAMUSCULAR | Status: AC
  Filled 2018-06-02: qty 10

## 2018-06-02 MED ORDER — SODIUM CHLORIDE 0.9 % IV SOLN: INTRAVENOUS | Status: DC

## 2018-06-02 MED ORDER — GLYCOPYRROLATE 0.2 MG/ML IJ SOLN
INTRAMUSCULAR | Status: AC
  Filled 2018-06-02: qty 1

## 2018-06-02 MED ORDER — SODIUM CHLORIDE 0.9 % IV SOLN
INTRAVENOUS | Status: DC
  Administered 2018-06-02 (×2): via INTRAVENOUS

## 2018-06-02 MED ORDER — LIDOCAINE HCL URETHRAL/MUCOSAL 2 % EX GEL
CUTANEOUS | Status: AC
  Filled 2018-06-02: qty 5

## 2018-06-02 MED ORDER — PROPOFOL 10 MG/ML IV BOLUS
INTRAVENOUS | Status: DC | PRN
  Administered 2018-06-02: 20 mg via INTRAVENOUS
  Administered 2018-06-02: 30 mg via INTRAVENOUS

## 2018-06-02 MED ORDER — FENTANYL CITRATE (PF) 100 MCG/2ML IJ SOLN
INTRAMUSCULAR | Status: AC
  Filled 2018-06-02: qty 2

## 2018-06-02 MED ORDER — LIDOCAINE HCL (PF) 2 % IJ SOLN
INTRAMUSCULAR | Status: DC | PRN
  Administered 2018-06-02: 100 mg

## 2018-06-02 NOTE — Transfer of Care (Signed)
Immediate Anesthesia Transfer of Care Note  Patient: Sierra Lewis  Procedure(s) Performed: ESOPHAGOGASTRODUODENOSCOPY (EGD) (N/A ) COLONOSCOPY WITH PROPOFOL (N/A )  Patient Location: PACU  Anesthesia Type:General  Level of Consciousness: sedated  Airway & Oxygen Therapy: Patient Spontanous Breathing and Patient connected to nasal cannula oxygen  Post-op Assessment: Report given to RN and Post -op Vital signs reviewed and stable  Post vital signs: Reviewed and stable  Last Vitals:  Vitals Value Taken Time  BP 126/65 06/02/2018  9:06 AM  Temp 36.4 C 06/02/2018  9:06 AM  Pulse 73 06/02/2018  9:06 AM  Resp 18 06/02/2018  9:06 AM  SpO2 96 % 06/02/2018  9:06 AM    Last Pain:  Vitals:   06/02/18 0906  TempSrc: Tympanic  PainSc: 0-No pain         Complications: No apparent anesthesia complications

## 2018-06-02 NOTE — H&P (Signed)
Primary Care Physician:  Martin Majestic, FNP Primary Gastroenterologist:  Dr. Vira Agar  Pre-Procedure History & Physical: HPI:  Sierra Lewis is a 68 y.o. female is here for an endoscopy and colonoscopy.   Past Medical History:  Diagnosis Date  . Anxiety   . GERD (gastroesophageal reflux disease)   . Hyperlipidemia   . Hypertension   . Insomnia   . Non-cardiac chest pain 03/14/2018    Past Surgical History:  Procedure Laterality Date  . ABDOMINAL HYSTERECTOMY    . BIOPSY BREAST    . COLONOSCOPY  01/03/2012   [name]    Prior to Admission medications   Medication Sig Start Date End Date Taking? Authorizing Provider  losartan-hydrochlorothiazide (HYZAAR) 100-25 MG tablet Take 1 tablet by mouth daily. 01/30/18  Yes [provider]  metoprolol tartrate (LOPRESSOR) 50 MG tablet Take 50 mg by mouth 2 (two) times daily.   Yes [provider]  omeprazole (PRILOSEC) 40 MG capsule Take 40 mg by mouth daily.    Yes [provider]  potassium chloride (K-DUR) 10 MEQ tablet Take 10 mEq by mouth daily.   Yes [provider]  simvastatin (ZOCOR) 40 MG tablet Take 40 mg by mouth at bedtime.   Yes [provider]  topiramate (TOPAMAX) 25 MG tablet Take 25 mg by mouth at bedtime.   Yes [provider]  busPIRone (BUSPAR) 10 MG tablet Take 10 mg by mouth 3 (three) times daily as needed (for anxiety).  01/30/18   [provider]  cloNIDine (CATAPRES) 0.1 MG tablet Take 0.1 mg by mouth every 6 (six) hours as needed (for tachycardia). May only take 2 per day 01/30/18   [provider]  fluticasone (FLONASE) 50 MCG/ACT nasal spray Place 2 sprays into both nostrils daily as needed for allergies.     [provider]  hydrOXYzine (ATARAX/VISTARIL) 25 MG tablet Take 25 mg by mouth 3 (three) times daily as needed for itching.    [provider]  PARoxetine (PAXIL) 20 MG tablet Take 20 mg by mouth daily.     [provider]  traZODone (DESYREL) 100 MG tablet Take 100 mg by mouth at bedtime.    [provider]  Vitamin D, Ergocalciferol, (DRISDOL) 50000 units CAPS capsule Take 50,000 Units by mouth every Thursday.    [provider]    Allergies as of 03/17/2018 - Review Complete 02/01/2018  Allergen Reaction Noted  . Benadryl [diphenhydramine] Swelling 10/11/2014  . Penicillins Swelling 10/11/2014    History reviewed. No pertinent family history.  Social History   Socioeconomic History  . Marital status: Divorced    Spouse name: Not on file  . Number of children: Not on file  . Years of education: Not on file  . Highest education level: Not on file  Occupational History  . Not on file  Social Needs  . Financial resource strain: Not on file  . Food insecurity:    Worry: Not on file    Inability: Not on file  . Transportation needs:    Medical: Not on file    Non-medical: Not on file  Tobacco Use  . Smoking status: Never Smoker  . Smokeless tobacco: Never Used  Substance and Sexual Activity  . Alcohol use: Yes    Comment: Wine minimal...every other week.  . Drug use: Not Currently  . Sexual activity: Not Currently  Lifestyle  . Physical activity:    Days per week: 0 days  Minutes per session: Not on file  . Stress: Not at all  Relationships  . Social connections:    Talks on phone: Not on file    Gets together: Not on file    Attends religious service: Not on file    Active member of club or organization: Not on file    Attends meetings of clubs or organizations: Not on file    Relationship status: Not on file  . Intimate partner violence:    Fear of current or ex partner: Not on file    Emotionally abused: Not on file    Physically abused: Not on file    Forced sexual activity: Not on file  Other Topics Concern  . Not on file  Social History Narrative  . Not on file    Review of Systems: See HPI, otherwise negative  ROS  Physical Exam: BP (!) 166/90   Pulse 60   Temp 97.8 F (36.6 C) (Tympanic)   Resp 18   Ht 5\' 3"  (1.6 m)   Wt 89.8 kg   SpO2 99%   BMI 35.07 kg/m  General:   Alert,  pleasant and cooperative in NAD Head:  Normocephalic and atraumatic. Neck:  Supple; no masses or thyromegaly. Lungs:  Clear throughout to auscultation.    Heart:  Regular rate and rhythm. Abdomen:  Soft, nontender and nondistended. Normal bowel sounds, without guarding, and without rebound.   Neurologic:  Alert and  oriented x4;  grossly normal neurologically.  Impression/Plan: Sierra Lewis is here for an endoscopy and colonoscopy to be performed for FH colon cancer.  Last colonoscopy was 01/03/2012.  Risks, benefits, limitations, and alternatives regarding  endoscopy and colonoscopy have been reviewed with the patient.  Questions have been answered.  All parties agreeable.   [name], MD  06/02/2018, 8:20 AM

## 2018-06-02 NOTE — Anesthesia Preprocedure Evaluation (Signed)
Anesthesia Evaluation  Patient identified by MRN, date of birth, ID band Patient awake    Reviewed: Allergy & Precautions, H&P , NPO status , Patient's Chart, lab work & pertinent test results, reviewed documented beta blocker date and time   Airway Mallampati: II   Neck ROM: full    Dental  (+) Poor Dentition   Pulmonary neg pulmonary ROS,    Pulmonary exam normal        Cardiovascular Exercise Tolerance: Good hypertension, On Medications negative cardio ROS Normal cardiovascular exam Rhythm:regular Rate:Normal     Neuro/Psych Anxiety negative neurological ROS  negative psych ROS   GI/Hepatic Neg liver ROS, GERD  Medicated,  Endo/Other  negative endocrine ROS  Renal/GU negative Renal ROS  negative genitourinary   Musculoskeletal   Abdominal   Peds  Hematology negative hematology ROS (+)   Anesthesia Other Findings Past Medical History: No date: Anxiety No date: GERD (gastroesophageal reflux disease) No date: Hyperlipidemia No date: Hypertension No date: Insomnia 03/14/2018: Non-cardiac chest pain Past Surgical History: No date: ABDOMINAL HYSTERECTOMY No date: BIOPSY BREAST 01/03/2012: COLONOSCOPY     Comment:  [facility] DAUGHTER   Reproductive/Obstetrics negative OB ROS                             Anesthesia Physical Anesthesia Plan  ASA: III  Anesthesia Plan: General   Post-op Pain Management:    Induction:   PONV Risk Score and Plan:   Airway Management Planned:   Additional Equipment:   Intra-op Plan:   Post-operative Plan:   Informed Consent: I have reviewed the patients History and Physical, chart, labs and discussed the procedure including the risks, benefits and alternatives for the proposed anesthesia with the patient or authorized representative who has indicated his/her understanding and acceptance.     Dental Advisory Given  Plan Discussed with:  CRNA  Anesthesia Plan Comments:         Anesthesia Quick Evaluation

## 2018-06-02 NOTE — Anesthesia Post-op Follow-up Note (Signed)
Anesthesia QCDR form completed.        

## 2018-06-02 NOTE — Op Note (Signed)
[facility] Patient Name: Sierra Lewis Procedure Date: 06/02/2018 8:06 AM MRN: [id number] Account #: [id number] Date of Birth: 1950-06-25 Admit Type: Outpatient Age: 68 Room: [facility] ENDO ROOM 3 Gender: Female Note Status: Finalized Procedure:            Upper GI endoscopy Indications:          Suspected gastro-esophageal reflux disease Providers:            Manya Silvas, MD Medicines:            Propofol per Anesthesia Complications:        No immediate complications. Procedure:            Pre-Anesthesia Assessment:                       - After reviewing the risks and benefits, the patient                        was deemed in satisfactory condition to undergo the                        procedure.                       After obtaining informed consent, the endoscope was                        passed under direct vision. Throughout the procedure,                        the patient's blood pressure, pulse, and oxygen                        saturations were monitored continuously. The Endoscope                        was introduced through the mouth, and advanced to the                        second part of duodenum. The upper GI endoscopy was                        accomplished without difficulty. The patient tolerated                        the procedure well. Findings:      LA Grade A (one or more mucosal breaks less than 5 mm, not extending       between tops of 2 mucosal folds) esophagitis with no bleeding was found       40 cm from the incisors. Biopsies were taken with a cold forceps for       histology.      Diffuse moderate inflammation characterized by erosions, granularity and       linear erosions was found in the gastric body and in the gastric antrum.       Biopsies were taken with a cold forceps for histology. Biopsies were       taken with a cold forceps for Helicobacter pylori testing.      The examined duodenum was  normal. Impression:           - LA Grade A  reflux esophagitis. Rule out Barrett's                        esophagus. Biopsied.                       - Gastritis. Biopsied.                       - Normal examined duodenum. Recommendation:       - Await pathology results. Manya Silvas, MD 06/02/2018 8:38:11 AM This report has been signed electronically. Number of Addenda: 0 Note Initiated On: 06/02/2018 8:06 AM      [facility]

## 2018-06-02 NOTE — Op Note (Signed)
[facility] Gastroenterology Patient Name: Sierra Lewis Procedure Date: 06/02/2018 8:06 AM MRN: [id number] Account #: [id number] Date of Birth: 11/12/1950 Admit Type: Outpatient Age: 68 Room: [facility] ENDO ROOM 3 Gender: Female Note Status: Finalized Procedure:            Colonoscopy Indications:          Screening for colorectal malignant neoplasm Providers:            Manya Silvas, MD Medicines:            Propofol per Anesthesia Complications:        No immediate complications. Procedure:            Pre-Anesthesia Assessment:                       - After reviewing the risks and benefits, the patient                        was deemed in satisfactory condition to undergo the                        procedure.                       After obtaining informed consent, the colonoscope was                        passed under direct vision. Throughout the procedure,                        the patient's blood pressure, pulse, and oxygen                        saturations were monitored continuously. The                        Colonoscope was introduced through the anus and                        advanced to the the cecum, identified by appendiceal                        orifice and ileocecal valve. The colonoscopy was                        somewhat difficult due to a redundant colon,                        significant looping and a tortuous colon. Successful                        completion of the procedure was aided by applying                        abdominal pressure. The patient tolerated the procedure                        well. The quality of the bowel preparation was                        excellent. Findings:      A diminutive  polyp was found in the proximal ascending colon. The polyp       was sessile. The polyp was removed with a jumbo cold forceps. Resection       and retrieval were complete.      A few small-mouthed diverticula were found in the sigmoid  colon.      Internal hemorrhoids were found during endoscopy. The hemorrhoids were       small and Grade I (internal hemorrhoids that do not prolapse).      The exam was otherwise without abnormality. Impression:           - One diminutive polyp in the proximal ascending colon,                        removed with a jumbo cold forceps. Resected and                        retrieved.                       - Diverticulosis in the sigmoid colon.                       - Internal hemorrhoids.                       - The examination was otherwise normal. Recommendation:       - Await pathology results. Manya Silvas, MD 06/02/2018 9:07:21 AM This report has been signed electronically. Number of Addenda: 0 Note Initiated On: 06/02/2018 8:06 AM Scope Withdrawal Time: 0 hours 7 minutes 30 seconds  Total Procedure Duration: 0 hours 21 minutes 22 seconds       [facility]

## 2018-06-03 NOTE — Anesthesia Postprocedure Evaluation (Signed)
Anesthesia Post Note  Patient: Sierra Lewis  Procedure(s) Performed: ESOPHAGOGASTRODUODENOSCOPY (EGD) (N/A ) COLONOSCOPY WITH PROPOFOL (N/A )  Patient location during evaluation: PACU Anesthesia Type: General Level of consciousness: awake and alert Pain management: pain level controlled Vital Signs Assessment: post-procedure vital signs reviewed and stable Respiratory status: spontaneous breathing, nonlabored ventilation, respiratory function stable and patient connected to nasal cannula oxygen Cardiovascular status: blood pressure returned to baseline and stable Postop Assessment: no apparent nausea or vomiting Anesthetic complications: no     Last Vitals:  Vitals:   06/02/18 0916 06/02/18 0926  BP: (!) 150/91 (!) 156/95  Pulse: 80 69  Resp: 18 18  Temp:    SpO2: 98% 100%    Last Pain:  Vitals:   06/02/18 0926  TempSrc:   PainSc: 0-No pain                 Molli Barrows

## 2018-06-05 ENCOUNTER — Encounter: Payer: Self-pay | Admitting: Unknown Physician Specialty

## 2018-06-06 LAB — SURGICAL PATHOLOGY

## 2019-06-11 DIAGNOSIS — E6609 Other obesity due to excess calories: Secondary | ICD-10-CM | POA: Insufficient documentation

## 2019-06-13 IMAGING — CR DG CHEST 2V
2 series · 2 of 2 positions shown · non-contrast
Comparison: 05/28/2015

CLINICAL DATA: Central chest pressure

EXAM:
CHEST - 2 VIEW

[chest pa]
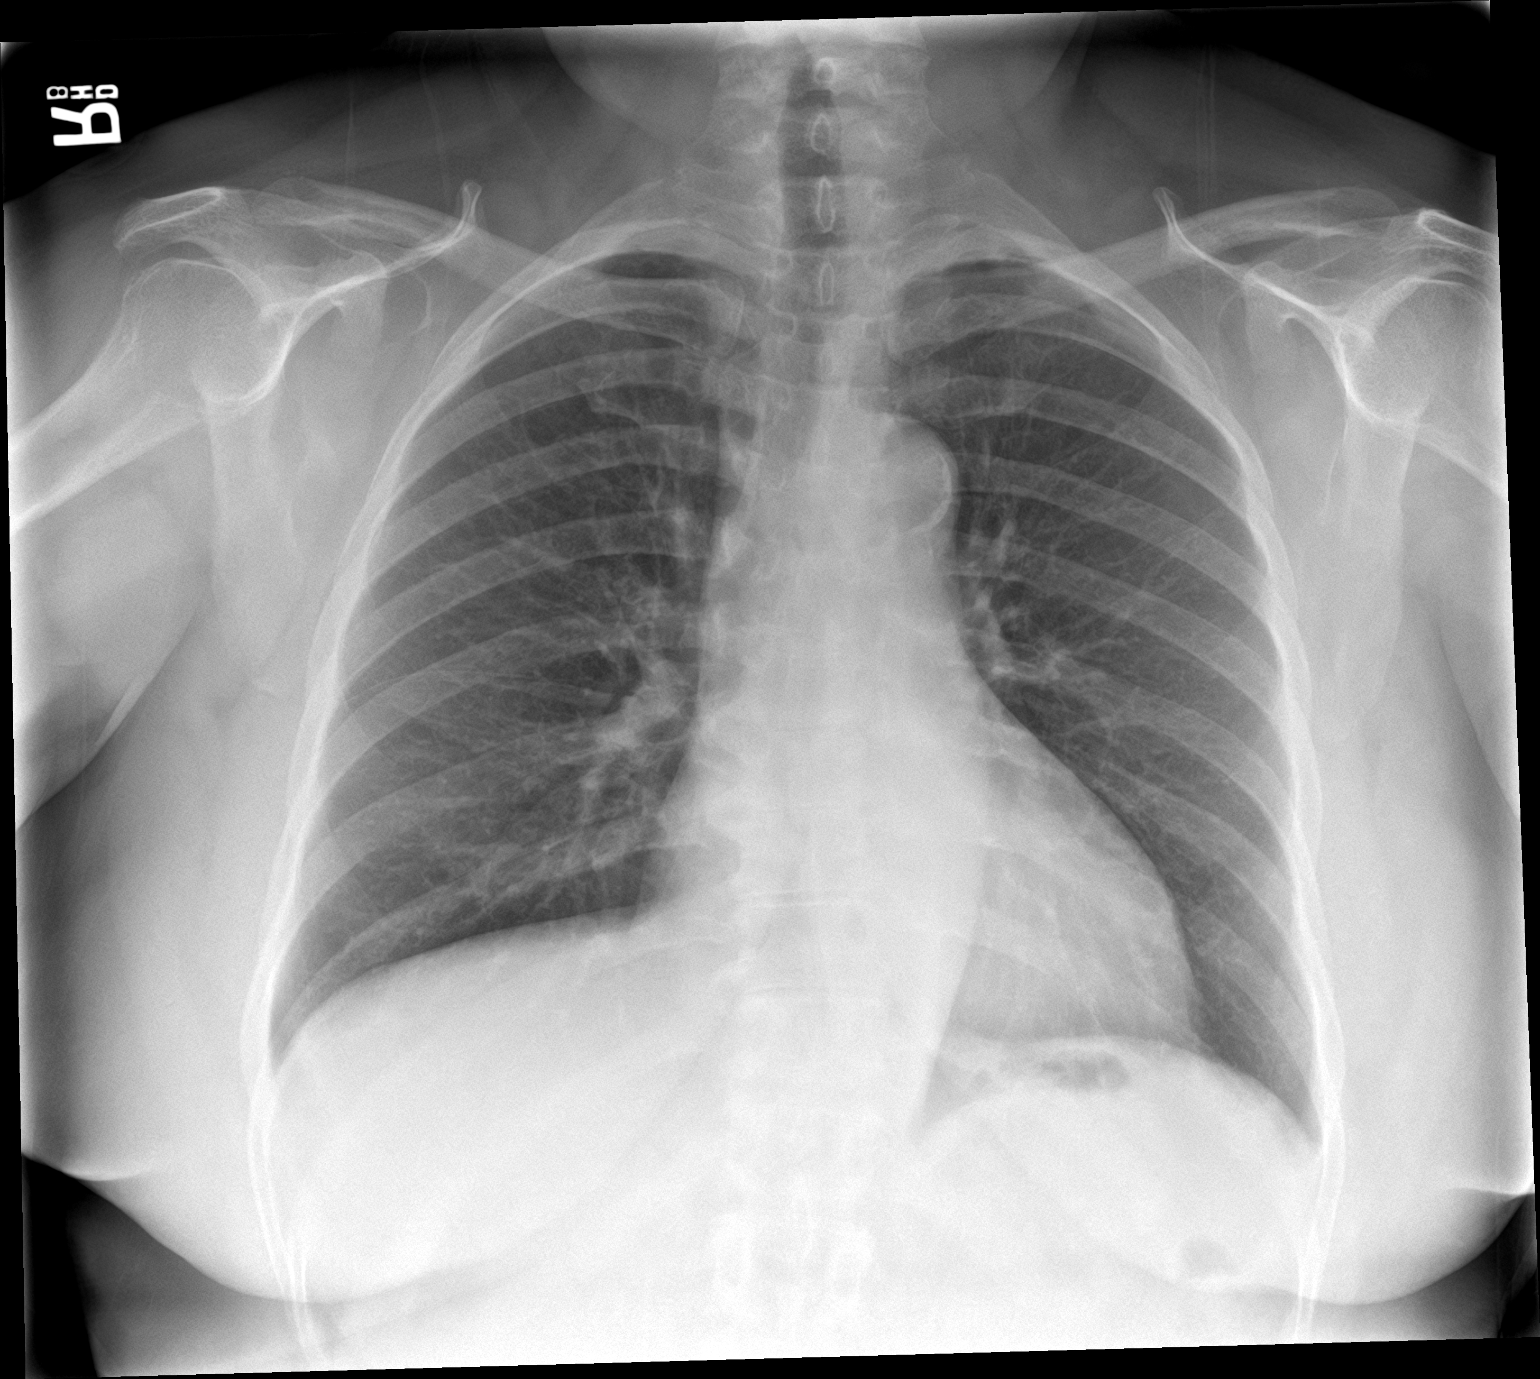

[chest lat]
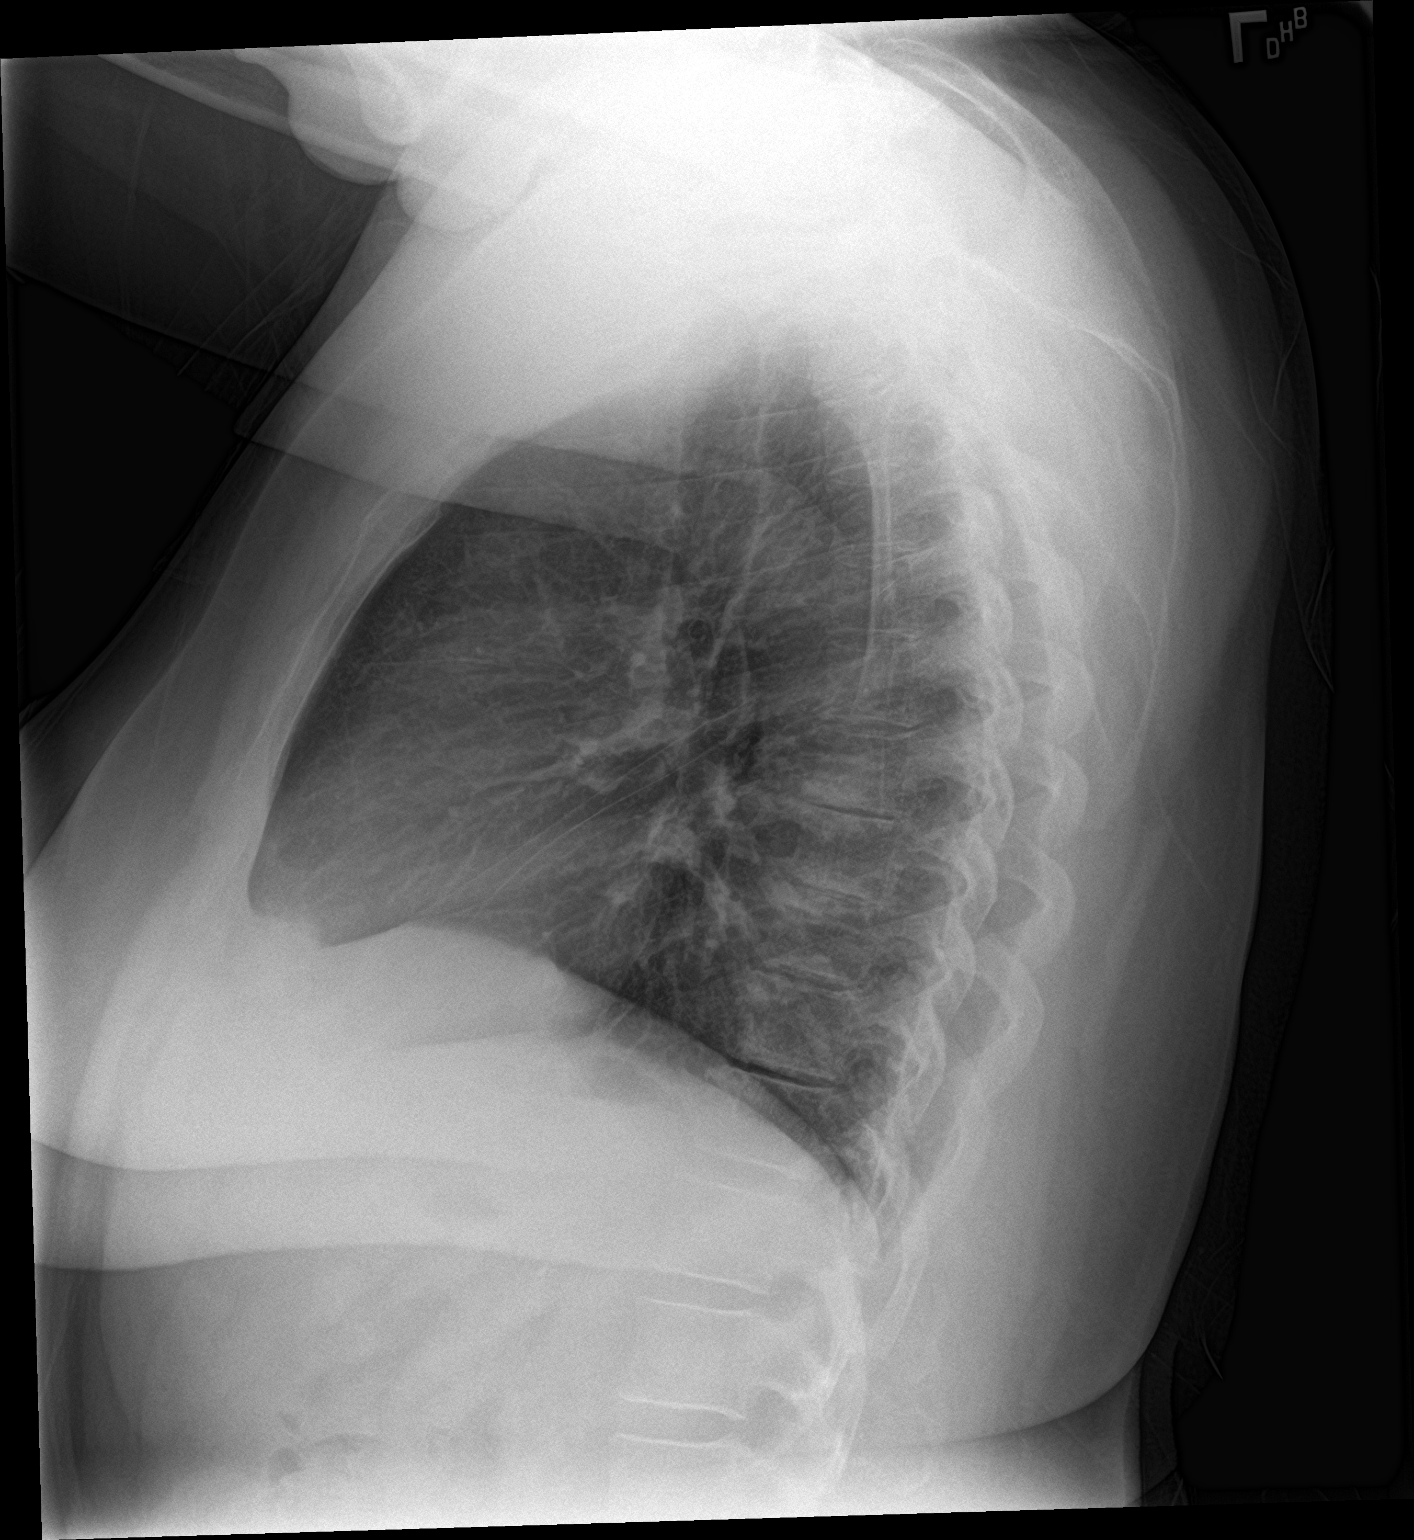

[2 of 2 positions shown; findings below may reference images not displayed]

FINDINGS: The heart size and mediastinal contours are within normal limits.
Moderate aortic atherosclerosis with slight uncoiling of the
thoracic aorta similar to prior. Both lungs are clear. The
visualized skeletal structures are unremarkable.
IMPRESSION: No active cardiopulmonary disease.  Aortic atherosclerosis.

## 2021-11-24 DIAGNOSIS — R269 Unspecified abnormalities of gait and mobility: Secondary | ICD-10-CM | POA: Insufficient documentation

## 2022-07-30 ENCOUNTER — Other Ambulatory Visit: Payer: Self-pay | Admitting: Family Medicine

## 2022-07-30 DIAGNOSIS — R600 Localized edema: Secondary | ICD-10-CM

## 2022-09-27 ENCOUNTER — Other Ambulatory Visit (HOSPITAL_COMMUNITY): Payer: Self-pay | Admitting: Family Medicine

## 2022-09-27 DIAGNOSIS — R600 Localized edema: Secondary | ICD-10-CM

## 2022-10-05 ENCOUNTER — Ambulatory Visit: Payer: Medicare HMO

## 2022-10-31 ENCOUNTER — Other Ambulatory Visit: Payer: Self-pay

## 2022-10-31 ENCOUNTER — Emergency Department
Admission: EM | Admit: 2022-10-31 | Discharge: 2022-10-31 | Disposition: A | Discharge status: Home / Self Care | Payer: Medicare HMO | Attending: Emergency Medicine | Admitting: Emergency Medicine

## 2022-10-31 ENCOUNTER — Emergency Department: Payer: Medicare HMO

## 2022-10-31 DIAGNOSIS — R519 Headache, unspecified: Secondary | ICD-10-CM

## 2022-10-31 LAB — BASIC METABOLIC PANEL
Anion gap: 12 (ref 5–15)
BUN: 10 mg/dL (ref 8–23)
CO2: 25 mmol/L (ref 22–32)
Calcium: 9.6 mg/dL (ref 8.9–10.3)
Chloride: 98 mmol/L (ref 98–111)
Creatinine, Ser: 0.78 mg/dL (ref 0.44–1.00)
GFR, Estimated: >60 mL/min (ref >60)
Glucose, Bld: 145 mg/dL — ABNORMAL HIGH (ref 70–99)
Potassium: 3 mmol/L — ABNORMAL LOW (ref 3.5–5.1)
Sodium: 135 mmol/L (ref 135–145)

## 2022-10-31 LAB — CBC WITH DIFFERENTIAL/PLATELET
Abs Immature Granulocytes: 0.01 10*3/uL (ref 0.00–0.07)
Basophils Absolute: 0 10*3/uL (ref 0.0–0.1)
Basophils Relative: 1 %
Eosinophils Absolute: 0 10*3/uL (ref 0.0–0.5)
Eosinophils Relative: 1 %
HCT: 37.6 % (ref 36.0–46.0)
Hemoglobin: 12.4 g/dL (ref 12.0–15.0)
Immature Granulocytes: 0 %
Lymphocytes Relative: 39 %
Lymphs Abs: 2 10*3/uL (ref 0.7–4.0)
MCH: 29.7 pg (ref 26.0–34.0)
MCHC: 33 g/dL (ref 30.0–36.0)
MCV: 90 fL (ref 80.0–100.0)
Monocytes Absolute: 0.4 10*3/uL (ref 0.1–1.0)
Monocytes Relative: 8 %
Neutro Abs: 2.7 10*3/uL (ref 1.7–7.7)
Neutrophils Relative %: 51 %
Platelets: 345 10*3/uL (ref 150–400)
RBC: 4.18 MIL/uL (ref 3.87–5.11)
RDW: 13.2 % (ref 11.5–15.5)
WBC: 5.1 10*3/uL (ref 4.0–10.5)
nRBC: 0 % (ref 0.0–0.2)

## 2022-10-31 MED ORDER — POTASSIUM CHLORIDE CRYS ER 20 MEQ PO TBCR
40.0000 meq | EXTENDED_RELEASE_TABLET | Freq: Once | ORAL | Status: AC
  Administered 2022-10-31: 40 meq via ORAL
  Filled 2022-10-31: qty 2

## 2022-10-31 MED ORDER — BUTALBITAL-APAP-CAFFEINE 50-325-40 MG PO TABS
1.0000 | ORAL_TABLET | Freq: Once | ORAL | Status: AC
  Administered 2022-10-31: 1 via ORAL
  Filled 2022-10-31: qty 1

## 2022-10-31 MED ORDER — SODIUM CHLORIDE 0.9 % IV BOLUS
500.0000 mL | Freq: Once | INTRAVENOUS | Status: AC
  Administered 2022-10-31: 500 mL via INTRAVENOUS

## 2022-10-31 MED ORDER — PROCHLORPERAZINE MALEATE 10 MG PO TABS: 10.0000 mg | ORAL_TABLET | Freq: Four times a day (QID) | ORAL | 0 refills | Status: AC | PRN

## 2022-10-31 MED ORDER — PROCHLORPERAZINE EDISYLATE 10 MG/2ML IJ SOLN
10.0000 mg | Freq: Once | INTRAMUSCULAR | Status: AC
  Administered 2022-10-31: 10 mg via INTRAVENOUS
  Filled 2022-10-31: qty 2

## 2022-10-31 NOTE — Discharge Instructions (Signed)
Please seek medical attention for any high fevers, chest pain, shortness of breath, change in behavior, persistent vomiting, bloody stool or any other new or concerning symptoms.  

## 2022-10-31 NOTE — ED Notes (Signed)
See triage note  Presents with headache  States pain is behind her eyes  She developed pain about 2 months  States pain comes and goes  Subjective fever occasionally   Has been seen by her PCP for same Afebrile on arrival

## 2022-10-31 NOTE — ED Triage Notes (Signed)
Pt to ED for "migraine since 2 months". Has been having more headaches recently. Denies blurry vision. Pain is to whole head. BP is 193/116. Hx HTN, states usually around 170/80. Took meds today.

## 2022-10-31 NOTE — ED Provider Notes (Signed)
[facility] Provider Note    Event Date/Time   First MD Initiated Contact with Patient 10/31/22 0802     (approximate)   History   Migraine   HPI  Sierra Lewis is a 72 y.o. female  who presents to the emergency department today because of concern for continued headache. It has been present for roughly 2-3 months. Located throughout her head. Has been intermittent but seems to be getting worse. States she saw a doctor in [place] about this and was given medication which she says was not helpful. She denies any change in vision. Denies any numbness or weakness in her arms or legs.     Physical Exam   Triage Vital Signs: ED Triage Vitals  Enc Vitals Group     BP 10/31/22 0738 (!) 193/116     Pulse Rate 10/31/22 0738 73     Resp 10/31/22 0738 16     Temp 10/31/22 0738 98.2 F (36.8 C)     Temp Source 10/31/22 0738 Oral     SpO2 10/31/22 0738 100 %     Weight 10/31/22 0739 195 lb (88.5 kg)     Height 10/31/22 0739 5\' 3"  (1.6 m)     Head Circumference --      Peak Flow --      Pain Score 10/31/22 0738 10     Pain Loc --      Pain Edu? --      Excl. in GC? --     Most recent vital signs: Vitals:   10/31/22 0738 10/31/22 0816  BP: (!) 193/116 (!) 202/91  Pulse: 73 70  Resp: 16 16  Temp: 98.2 F (36.8 C)   SpO2: 100% 100%   General: Awake, alert, oriented. CV:  Good peripheral perfusion. Regular rate and rhythm. Resp:  Normal effort. Lungs clear. Abd:  No distention.  Other:  TMs normal bilaterally.   ED Results / Procedures / Treatments   Labs (all labs ordered are listed, but only abnormal results are displayed) Labs Reviewed  BASIC METABOLIC PANEL - Abnormal; Notable for the following components:      Result Value   Potassium 3.0 (*)    Glucose, Bld 145 (*)    All other components within normal limits  CBC WITH DIFFERENTIAL/PLATELET     EKG  None   RADIOLOGY I independently interpreted and visualized the ct head. My  interpretation: No bleed.  Radiology interpretation: IMPRESSION: 1. No acute or reversible finding. 2. 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     PROCEDURES:  Critical Care performed: No   MEDICATIONS ORDERED IN ED: Medications - No data to display   IMPRESSION / MDM / ASSESSMENT AND PLAN / ED COURSE  I reviewed the triage vital signs and the nursing notes.                              Differential diagnosis includes, but is not limited to, intracranial bleed, mass, tension headache, migraine  Patient's presentation is most consistent with acute presentation with potential threat to life or bodily function.   Patient presented to the emergency department today because of concerns for bad headache that is been present for the past 2 to 3 months.  On exam patient without any concerning physical exam findings.  CT head did not show any bleed or large mass.  Blood work with slight hypokalemia  however no other electrolyte abnormalities.  Patient did feel better after medication.  At this time given chronicity of headache do not feel any further emergent workup is necessary.  Will give patient neurology information.   FINAL CLINICAL IMPRESSION(S) / ED DIAGNOSES   Final diagnoses:  Bad headache      Note:  This document was prepared using Dragon voice recognition software and may include unintentional dictation errors.    Phineas Semen, MD 10/31/22 1228

## 2022-12-02 ENCOUNTER — Ambulatory Visit (INDEPENDENT_AMBULATORY_CARE_PROVIDER_SITE_OTHER): Payer: Medicare HMO | Admitting: Nurse Practitioner

## 2022-12-02 ENCOUNTER — Encounter: Payer: Self-pay | Admitting: Nurse Practitioner

## 2022-12-02 ENCOUNTER — Ambulatory Visit: Payer: Medicare HMO | Admitting: Nurse Practitioner

## 2022-12-02 VITALS — BP 136/78 | HR 70 | Temp 98.1°F | Ht 63.0 in | Wt 193.2 lb

## 2022-12-02 DIAGNOSIS — F419 Anxiety disorder, unspecified: Secondary | ICD-10-CM

## 2022-12-02 DIAGNOSIS — E785 Hyperlipidemia, unspecified: Secondary | ICD-10-CM | POA: Diagnosis not present

## 2022-12-02 DIAGNOSIS — R519 Headache, unspecified: Secondary | ICD-10-CM | POA: Insufficient documentation

## 2022-12-02 DIAGNOSIS — F32A Depression, unspecified: Secondary | ICD-10-CM

## 2022-12-02 DIAGNOSIS — F1090 Alcohol use, unspecified, uncomplicated: Secondary | ICD-10-CM

## 2022-12-02 DIAGNOSIS — I10 Essential (primary) hypertension: Secondary | ICD-10-CM | POA: Diagnosis not present

## 2022-12-02 DIAGNOSIS — R7303 Prediabetes: Secondary | ICD-10-CM | POA: Diagnosis not present

## 2022-12-02 NOTE — Patient Instructions (Signed)
Please schedule fasting labs. Follow up in 2 weeks.

## 2022-12-02 NOTE — Progress Notes (Signed)
New Patient Office Visit  Subjective    Patient ID: Sierra Lewis, female    DOB: 05-Sep-1950  Age: 72 y.o. MRN: [id number]  CC:  Chief Complaint  Patient presents with   Establish Care    HPI Sierra Lewis presents to establish care with her daughter Ms. Sierra Lewis.  Her previous PCP was [facility], [place].  She has history of hypertension, hyperlipidemia, heavy alcohol use, depression, anxiety, prediabetes and headaches.  The daughter states that she has long history of depression and anxiety. Ms. Sierra Lewis lives with her son at present but the son is ready to move out. The daughter is concerned regarding her gait and confusion at times and would like her to get some help at home. with daughter is concerned regarding increased dependence on  gambling and drinking.  She has been seen at [facility] neurology for headache.  She was advised to take Fioricet at headache onset and no more than 2 pills in 24 hours and not more than 2-3 times a week.  She was also advised to decrease Paxil to 10 mg and taking Effexor 37.5 mg.  Patient states that she is not taking Effexor and want to continue Paxil.  Health Maintenance  Topic Date Due   Medicare Annual Wellness (AWV)  Never done   Hepatitis C Screening  Never done   DTaP/Tdap/Td (1 - Tdap) Never done   MAMMOGRAM  Never done   Zoster Vaccines- Shingrix (1 of 2) Never done   DEXA SCAN  Never done   Pneumonia Vaccine 59+ Years old (2 of 2 - PCV) 02/02/2019   COVID-19 Vaccine (1 - 2023-24 season) Never done   INFLUENZA VACCINE  11/25/2022   Colonoscopy  06/02/2028   HPV VACCINES  Aged Out    There are no preventive care reminders to display for this patient.  Outpatient Encounter Medications as of 12/02/2022  Medication Sig   fluticasone (FLONASE) 50 MCG/ACT nasal spray Place 2 sprays into both nostrils daily as needed for allergies.    losartan-hydrochlorothiazide (HYZAAR) 100-25 MG tablet Take 1 tablet  by mouth daily.   metoprolol tartrate (LOPRESSOR) 50 MG tablet Take 50 mg by mouth 2 (two) times daily.   omeprazole (PRILOSEC) 40 MG capsule Take 40 mg by mouth daily.    PARoxetine (PAXIL) 20 MG tablet Take 20 mg by mouth daily.   potassium chloride (K-DUR) 10 MEQ tablet Take 10 mEq by mouth daily. (Patient not taking: Reported on 12/02/2022)   prochlorperazine (COMPAZINE) 10 MG tablet Take 1 tablet (10 mg total) by mouth every 6 (six) hours as needed (headache). (Patient not taking: Reported on 12/02/2022)   simvastatin (ZOCOR) 40 MG tablet Take 40 mg by mouth at bedtime.   topiramate (TOPAMAX) 25 MG tablet Take 25 mg by mouth at bedtime.   traZODone (DESYREL) 100 MG tablet Take 100 mg by mouth at bedtime.   Vitamin D, Ergocalciferol, (DRISDOL) 50000 units CAPS capsule Take 50,000 Units by mouth every Thursday.   [DISCONTINUED] busPIRone (BUSPAR) 10 MG tablet Take 10 mg by mouth 3 (three) times daily as needed (for anxiety).    [DISCONTINUED] cloNIDine (CATAPRES) 0.1 MG tablet Take 0.1 mg by mouth every 6 (six) hours as needed (for tachycardia). May only take 2 per day   [DISCONTINUED] hydrOXYzine (ATARAX/VISTARIL) 25 MG tablet Take 25 mg by mouth 3 (three) times daily as needed for itching.   No facility-administered encounter medications on file as of 12/02/2022.  Past Medical History:  Diagnosis Date   Anxiety    Depression    GERD (gastroesophageal reflux disease)    Hyperlipidemia    Hypertension    Insomnia    Non-cardiac chest pain 03/14/2018   Sleep apnea     Past Surgical History:  Procedure Laterality Date   ABDOMINAL HYSTERECTOMY     BIOPSY BREAST     COLONOSCOPY  01/03/2012   [facility] DAUGHTER   COLONOSCOPY WITH PROPOFOL N/A 06/02/2018   Procedure: COLONOSCOPY WITH PROPOFOL;  Surgeon: Scot Jun, MD;  Location: [facility] ENDOSCOPY;  Service: Endoscopy;  Laterality: N/A;   ESOPHAGOGASTRODUODENOSCOPY N/A 06/02/2018   Procedure: ESOPHAGOGASTRODUODENOSCOPY (EGD);  Surgeon:  Scot Jun, MD;  Location: [facility] ENDOSCOPY;  Service: Endoscopy;  Laterality: N/A;    No family history on file.  Social History   Socioeconomic History   Marital status: Divorced    Spouse name: Not on file   Number of children: Not on file   Years of education: Not on file   Highest education level: Not on file  Occupational History   Not on file  Tobacco Use   Smoking status: Never   Smokeless tobacco: Never  Vaping Use   Vaping status: Never Used  Substance and Sexual Activity   Alcohol use: Yes    Alcohol/week: 5.0 standard drinks of alcohol    Types: 5 Shots of liquor per week   Drug use: Not Currently   Sexual activity: Not Currently  Other Topics Concern   Not on file  Social History Narrative   Not on file   Social Determinants of Health   Financial Resource Strain: Medium Risk (12/02/2022)   Overall Financial Resource Strain (CARDIA)    Difficulty of Paying Living Expenses: Somewhat hard  Food Insecurity: Food Insecurity Present (12/02/2022)   Hunger Vital Sign    Worried About Running Out of Food in the Last Year: Sometimes true    Ran Out of Food in the Last Year: Sometimes true  Transportation Needs: Unmet Transportation Needs (12/02/2022)   PRAPARE - Transportation    Lack of Transportation (Medical): Yes    Lack of Transportation (Non-Medical): Yes  Physical Activity: Unknown (12/02/2022)   Exercise Vital Sign    Days of Exercise per Week: 0 days    Minutes of Exercise per Session: Not on file  Stress: Stress Concern Present (12/02/2022)   [name] of Occupational Health - Occupational Stress Questionnaire    Feeling of Stress : Very much  Social Connections: Moderately Isolated (12/02/2022)   Social Connection and Isolation Panel [NHANES]    Frequency of Communication with Friends and Family: Once a week    Frequency of Social Gatherings with Friends and Family: Three times a week    Attends Religious Services: More than 4 times per year     Active Member of Clubs or Organizations: No    Attends Banker Meetings: Not on file    Marital Status: Divorced  Intimate Partner Violence: Not on file    ROS Negative unless indicated in HPI.      Objective    BP 136/78   Pulse 70   Temp 98.1 F (36.7 C) (Oral)   Ht 5\' 3"  (1.6 m)   Wt 193 lb 3.2 oz (87.6 kg)   SpO2 94%   BMI 34.22 kg/m   Physical Exam Constitutional:      Appearance: Normal appearance.  HENT:     Right Ear: Tympanic membrane normal.  Left Ear: Tympanic membrane normal.     Mouth/Throat:     Mouth: Mucous membranes are moist.  Eyes:     Conjunctiva/sclera: Conjunctivae normal.     Pupils: Pupils are equal, round, and reactive to light.  Cardiovascular:     Rate and Rhythm: Normal rate and regular rhythm.     Pulses: Normal pulses.     Heart sounds: Normal heart sounds.  Pulmonary:     Effort: Pulmonary effort is normal.     Breath sounds: Normal breath sounds.  Abdominal:     General: Bowel sounds are normal.     Palpations: Abdomen is soft.  Musculoskeletal:     Cervical back: Normal range of motion. No tenderness.  Skin:    General: Skin is warm.     Findings: No bruising.  Neurological:     General: No focal deficit present.     Mental Status: She is alert and oriented to person, place, and time. Mental status is at baseline.  Psychiatric:        Mood and Affect: Mood normal.        Behavior: Behavior normal.        Thought Content: Thought content normal.        Judgment: Judgment normal.         Assessment & Plan:  Hyperlipidemia, unspecified hyperlipidemia type Assessment & Plan: Continue simvastatin 40 mg daily. Diet management and regular exercise discussed. Will check lipid panel  Orders: -     Lipid panel; Future  Prediabetes Assessment & Plan: Will check hemoglobin A1c.  Orders: -     Hemoglobin A1c; Future  Hypertension, essential Assessment & Plan: Patient BP  Vitals:   12/02/22 1454   BP: 136/78    in the office  Advised pt to follow a low sodium and heart healthy diet. Continue losartan/HCTZ, metoprolol Labs ordered.      Orders: -     CBC with Differential/Platelet; Future -     Comprehensive metabolic panel; Future  Anxiety and depression Assessment & Plan: PHQ-9 score 23 and GAD score 14 Continue Paxil 20 mg daily.   Alcohol use disorder Assessment & Plan: Education provided about the effects of alcohol on body and brain. Encourage patient to participate in Alcoholic Anonymous support group. Patient verbalized understanding.     No follow-ups on file.   Kara Dies, NP

## 2022-12-09 ENCOUNTER — Other Ambulatory Visit (INDEPENDENT_AMBULATORY_CARE_PROVIDER_SITE_OTHER): Payer: Medicare HMO

## 2022-12-09 DIAGNOSIS — R7303 Prediabetes: Secondary | ICD-10-CM

## 2022-12-09 DIAGNOSIS — I10 Essential (primary) hypertension: Secondary | ICD-10-CM | POA: Diagnosis not present

## 2022-12-09 DIAGNOSIS — E785 Hyperlipidemia, unspecified: Secondary | ICD-10-CM | POA: Diagnosis not present

## 2022-12-09 LAB — COMPREHENSIVE METABOLIC PANEL
ALT: 12 U/L (ref 0–35)
AST: 13 U/L (ref 0–37)
Albumin: 4.3 g/dL (ref 3.5–5.2)
Alkaline Phosphatase: 88 U/L (ref 39–117)
BUN: 11 mg/dL (ref 6–23)
CO2: 28 mEq/L (ref 19–32)
Calcium: 10 mg/dL (ref 8.4–10.5)
Chloride: 100 mEq/L (ref 96–112)
Creatinine, Ser: 0.81 mg/dL (ref 0.40–1.20)
GFR: 71.99 mL/min (ref >60.00)
Glucose, Bld: 120 mg/dL — ABNORMAL HIGH (ref 70–99)
Potassium: 3 mEq/L — ABNORMAL LOW (ref 3.5–5.1)
Sodium: 136 mEq/L (ref 135–145)
Total Bilirubin: 0.4 mg/dL (ref 0.2–1.2)
Total Protein: 6.9 g/dL (ref 6.0–8.3)

## 2022-12-09 LAB — CBC WITH DIFFERENTIAL/PLATELET
Basophils Absolute: 0 10*3/uL (ref 0.0–0.1)
Basophils Relative: 0.7 % (ref 0.0–3.0)
Eosinophils Absolute: 0.1 10*3/uL (ref 0.0–0.7)
Eosinophils Relative: 1.2 % (ref 0.0–5.0)
HCT: 37.9 % (ref 36.0–46.0)
Hemoglobin: 12.3 g/dL (ref 12.0–15.0)
Lymphocytes Relative: 48 % — ABNORMAL HIGH (ref 12.0–46.0)
Lymphs Abs: 2.3 10*3/uL (ref 0.7–4.0)
MCHC: 32.4 g/dL (ref 30.0–36.0)
MCV: 91.8 fl (ref 78.0–100.0)
Monocytes Absolute: 0.4 10*3/uL (ref 0.1–1.0)
Monocytes Relative: 8.2 % (ref 3.0–12.0)
Neutro Abs: 2 10*3/uL (ref 1.4–7.7)
Neutrophils Relative %: 41.9 % — ABNORMAL LOW (ref 43.0–77.0)
Platelets: 334 10*3/uL (ref 150.0–400.0)
RBC: 4.13 Mil/uL (ref 3.87–5.11)
RDW: 14.5 % (ref 11.5–15.5)
WBC: 4.8 10*3/uL (ref 4.0–10.5)

## 2022-12-09 LAB — LIPID PANEL
Cholesterol: 182 mg/dL (ref 0–200)
HDL: 49.4 mg/dL (ref >39.00)
LDL Cholesterol: 99 mg/dL (ref 0–99)
NonHDL: 132.73
Total CHOL/HDL Ratio: 4
Triglycerides: 170 mg/dL — ABNORMAL HIGH (ref 0.0–149.0)
VLDL: 34 mg/dL (ref 0.0–40.0)

## 2022-12-09 LAB — HEMOGLOBIN A1C: Hgb A1c MFr Bld: 6.5 % (ref 4.6–6.5)

## 2022-12-15 ENCOUNTER — Encounter: Payer: Self-pay | Admitting: Nurse Practitioner

## 2022-12-15 ENCOUNTER — Telehealth: Payer: Self-pay | Admitting: Nurse Practitioner

## 2022-12-15 DIAGNOSIS — F32A Depression, unspecified: Secondary | ICD-10-CM | POA: Insufficient documentation

## 2022-12-15 NOTE — Assessment & Plan Note (Signed)
Continue simvastatin 40 mg daily. Diet management and regular exercise discussed. Will check lipid panel

## 2022-12-15 NOTE — Assessment & Plan Note (Signed)
Will check hemoglobin A1c 

## 2022-12-15 NOTE — Telephone Encounter (Signed)
Patient just called and said she is out of her medication name PARoxetine (PAXIL) 20 MG tablet. Patient doesn't have anymore medication. Her number is [phone]. The pharmacy she uses is [facility] [address] (N), [place] - [address] [place] ([place]) [place] [address] Phone: [phone]  Fax: [phone]

## 2022-12-15 NOTE — Assessment & Plan Note (Signed)
PHQ-9 score 23 and GAD score 14 Continue Paxil 20 mg daily.

## 2022-12-15 NOTE — Assessment & Plan Note (Signed)
Education provided about the effects of alcohol on body and brain. Encourage patient to participate in Alcoholic Anonymous support group. Patient verbalized understanding.

## 2022-12-15 NOTE — Assessment & Plan Note (Signed)
Patient BP  Vitals:   12/02/22 1454  BP: 136/78    in the office  Advised pt to follow a low sodium and heart healthy diet. Continue losartan/HCTZ, metoprolol Labs ordered.

## 2022-12-16 ENCOUNTER — Other Ambulatory Visit: Payer: Self-pay

## 2022-12-16 NOTE — Telephone Encounter (Signed)
Refill request sent to provider.

## 2022-12-16 NOTE — Telephone Encounter (Signed)
Pt requesting refill, was filled by a previous provider

## 2022-12-20 NOTE — Telephone Encounter (Signed)
Patient is calling back to state she needs refills.  I spoke with patient and she states I may speak with her daughter, Sierra Lewis.  Sierra Lewis states patient has recently had a new patient visit with Kara Dies, NP, and was supposed to have her medications refilled.  Sierra Lewis states patient has been out of her blood pressure medication for a week.  Sierra Lewis states patient is out of some of her other medications now.   Prescription Request  12/20/2022  LOV: Visit date not found  What is the name of the medication or equipment? Amlodipine 10 MG once a day, Montelukast 10 MG one tablet by mouth once a day, simvastatin (ZOCOR) 40 MG tablet, omeprazole (PRILOSEC) 20 MG capsule (different from our list), losartan-hydrochlorothiazide (HYZAAR) 100-12.5 MG tablet (different from our list), Metformin 500 MG twice a day, metoprolol tartrate (LOPRESSOR) 50 MG tablet, Vitamin D2 1.25 MG once capsule by mouth once a week, Azelastinehcl Nasal Spray, Futicasone Propionate Nasal Spray, PARoxetine (PAXIL) 20 MG tablet.  Have you contacted your pharmacy to request a refill? Yes   Which pharmacy would you like this sent to?  [facility] on [address] in [place]. Phone:  [phone]    Patient notified that their request is being sent to the clinical staff for review and that they should receive a response within 2 business days.   Please advise at [facility] [phone]  Sierra Lewis would like to have a call back from [place] once the prescriptions have been sent to patient's pharmacy.

## 2022-12-21 ENCOUNTER — Ambulatory Visit: Payer: Medicare HMO | Admitting: Nurse Practitioner

## 2022-12-21 ENCOUNTER — Encounter: Payer: Self-pay | Admitting: Nurse Practitioner

## 2022-12-21 VITALS — BP 146/84 | HR 69 | Temp 98.0°F | Ht 63.0 in | Wt 192.6 lb

## 2022-12-21 DIAGNOSIS — F32A Depression, unspecified: Secondary | ICD-10-CM

## 2022-12-21 DIAGNOSIS — F419 Anxiety disorder, unspecified: Secondary | ICD-10-CM | POA: Diagnosis not present

## 2022-12-21 DIAGNOSIS — R2689 Other abnormalities of gait and mobility: Secondary | ICD-10-CM | POA: Insufficient documentation

## 2022-12-21 DIAGNOSIS — E785 Hyperlipidemia, unspecified: Secondary | ICD-10-CM | POA: Diagnosis not present

## 2022-12-21 DIAGNOSIS — E876 Hypokalemia: Secondary | ICD-10-CM

## 2022-12-21 MED ORDER — METOPROLOL TARTRATE 50 MG PO TABS: 50.0000 mg | ORAL_TABLET | Freq: Two times a day (BID) | ORAL | 2 refills | Status: AC

## 2022-12-21 MED ORDER — SIMVASTATIN 40 MG PO TABS: 40.0000 mg | ORAL_TABLET | Freq: Every day | ORAL | 1 refills | Status: AC

## 2022-12-21 MED ORDER — METFORMIN HCL 500 MG PO TABS: 500.0000 mg | ORAL_TABLET | Freq: Two times a day (BID) | ORAL | 3 refills | Status: AC

## 2022-12-21 MED ORDER — LOSARTAN POTASSIUM-HCTZ 100-25 MG PO TABS: 1.0000 | ORAL_TABLET | Freq: Every day | ORAL | 2 refills | Status: AC

## 2022-12-21 MED ORDER — TRAZODONE HCL 50 MG PO TABS
25.0000 mg | ORAL_TABLET | Freq: Every evening | ORAL | 1 refills | Status: AC | PRN
Start: 2022-12-21 — End: ?

## 2022-12-21 NOTE — Telephone Encounter (Signed)
Medication refilled during the visit.

## 2022-12-21 NOTE — Patient Instructions (Signed)
Check blood pressure at home daily at same time for 2 weeks and send readings via MyChart. Referral sent to [facility] sent to the pharmacy. Will check potassium in 2 weeks.

## 2022-12-21 NOTE — Progress Notes (Signed)
Established Patient Office Visit  Subjective:  Patient ID: Sierra Lewis, female    DOB: 04/02/1951  Age: 72 y.o. MRN: [id number]  CC:  Chief Complaint  Patient presents with   Medical Management of Chronic Issues    HPI  Sierra Lewis presents for: follow-up of lab and medication refill.  She is accompanied by her daughter.  She has history of anxiety and depression.  The daughter would like her to be seen by psychiatrist.  She is taking Effexor 37.5 mg daily.  The daughter is also concerned about her gait and cognitive decline.  She would like [place] to complete the paperwork for help at home.  Triglycerides level  elevated 170, hemoglobin A1c 6.5, potassium 3. HPI   Past Medical History:  Diagnosis Date   Anxiety    Depression    GERD (gastroesophageal reflux disease)    Hyperlipidemia    Hypertension    Insomnia    Non-cardiac chest pain 03/14/2018   Sleep apnea     Past Surgical History:  Procedure Laterality Date   ABDOMINAL HYSTERECTOMY     BIOPSY BREAST     COLONOSCOPY  01/03/2012   [facility] DAUGHTER   COLONOSCOPY WITH PROPOFOL N/A 06/02/2018   Procedure: COLONOSCOPY WITH PROPOFOL;  Surgeon: Scot Jun, MD;  Location: [facility] ENDOSCOPY;  Service: Endoscopy;  Laterality: N/A;   ESOPHAGOGASTRODUODENOSCOPY N/A 06/02/2018   Procedure: ESOPHAGOGASTRODUODENOSCOPY (EGD);  Surgeon: Scot Jun, MD;  Location: [facility] ENDOSCOPY;  Service: Endoscopy;  Laterality: N/A;    History reviewed. No pertinent family history.  Social History   Socioeconomic History   Marital status: Divorced    Spouse name: Not on file   Number of children: Not on file   Years of education: Not on file   Highest education level: Not on file  Occupational History   Not on file  Tobacco Use   Smoking status: Never   Smokeless tobacco: Never  Vaping Use   Vaping status: Never Used  Substance and Sexual Activity   Alcohol use: Yes    Alcohol/week: 5.0 standard drinks of alcohol     Types: 5 Shots of liquor per week   Drug use: Not Currently   Sexual activity: Not Currently  Other Topics Concern   Not on file  Social History Narrative   Not on file   Social Determinants of Health   Financial Resource Strain: Medium Risk (12/02/2022)   Overall Financial Resource Strain (CARDIA)    Difficulty of Paying Living Expenses: Somewhat hard  Food Insecurity: Food Insecurity Present (12/02/2022)   Hunger Vital Sign    Worried About Running Out of Food in the Last Year: Sometimes true    Ran Out of Food in the Last Year: Sometimes true  Transportation Needs: Unmet Transportation Needs (12/02/2022)   PRAPARE - Transportation    Lack of Transportation (Medical): Yes    Lack of Transportation (Non-Medical): Yes  Physical Activity: Unknown (12/02/2022)   Exercise Vital Sign    Days of Exercise per Week: 0 days    Minutes of Exercise per Session: Not on file  Stress: Stress Concern Present (12/02/2022)   [name] of Occupational Health - Occupational Stress Questionnaire    Feeling of Stress : Very much  Social Connections: Moderately Isolated (12/02/2022)   Social Connection and Isolation Panel [NHANES]    Frequency of Communication with Friends and Family: Once a week    Frequency of Social Gatherings with Friends and Family: Three times a  week    Attends Religious Services: More than 4 times per year    Active Member of Clubs or Organizations: No    Attends Engineer, structural: Not on file    Marital Status: Divorced  Intimate Partner Violence: Not on file     Outpatient Medications Prior to Visit  Medication Sig Dispense Refill   fluticasone (FLONASE) 50 MCG/ACT nasal spray Place 2 sprays into both nostrils daily as needed for allergies.      omeprazole (PRILOSEC) 40 MG capsule Take 40 mg by mouth daily.     potassium chloride (K-DUR) 10 MEQ tablet Take 10 mEq by mouth daily.     prochlorperazine (COMPAZINE) 10 MG tablet Take 1 tablet (10 mg total) by  mouth every 6 (six) hours as needed (headache). 15 tablet 0   traZODone (DESYREL) 100 MG tablet Take 100 mg by mouth at bedtime.     Vitamin D, Ergocalciferol, (DRISDOL) 50000 units CAPS capsule Take 50,000 Units by mouth every Thursday.     losartan-hydrochlorothiazide (HYZAAR) 100-25 MG tablet Take 1 tablet by mouth daily.     metoprolol tartrate (LOPRESSOR) 50 MG tablet Take 50 mg by mouth 2 (two) times daily.     PARoxetine (PAXIL) 20 MG tablet Take 20 mg by mouth daily.     simvastatin (ZOCOR) 40 MG tablet Take 40 mg by mouth at bedtime.     topiramate (TOPAMAX) 25 MG tablet Take 25 mg by mouth at bedtime. (Patient not taking: Reported on 12/21/2022)     No facility-administered medications prior to visit.    Allergies  Allergen Reactions   Benadryl [Diphenhydramine] Swelling   Penicillins Swelling    Has patient had a PCN reaction causing immediate rash, facial/tongue/throat swelling, SOB or lightheadedness with hypotension: Yes Has patient had a PCN reaction causing severe rash involving mucus membranes or skin necrosis: No Has patient had a PCN reaction that required hospitalization No Has patient had a PCN reaction occurring within the last 10 years: No If all of the above answers are "NO", then may proceed with Cephalosporin use.    ROS Review of Systems Negative unless indicated in HPI.    Objective:    Physical Exam Constitutional:      Appearance: Normal appearance.  HENT:     Mouth/Throat:     Mouth: Mucous membranes are moist.  Eyes:     Conjunctiva/sclera: Conjunctivae normal.     Pupils: Pupils are equal, round, and reactive to light.  Cardiovascular:     Rate and Rhythm: Normal rate and regular rhythm.     Pulses: Normal pulses.     Heart sounds: Normal heart sounds.  Pulmonary:     Effort: Pulmonary effort is normal.     Breath sounds: Normal breath sounds.  Musculoskeletal:     Cervical back: Normal range of motion. No tenderness.  Skin:    General:  Skin is warm.     Findings: No bruising.  Neurological:     General: No focal deficit present.     Mental Status: She is alert and oriented to person, place, and time. Mental status is at baseline.  Psychiatric:        Mood and Affect: Mood normal.        Behavior: Behavior normal.        Thought Content: Thought content normal.        Judgment: Judgment normal.     BP (!) 146/84   Pulse 69   Temp  98 F (36.7 C)   Ht 5\' 3"  (1.6 m)   Wt 192 lb 9.6 oz (87.4 kg)   SpO2 97%   BMI 34.12 kg/m  Wt Readings from Last 3 Encounters:  12/21/22 192 lb 9.6 oz (87.4 kg)  12/02/22 193 lb 3.2 oz (87.6 kg)  10/31/22 195 lb (88.5 kg)     Health Maintenance  Topic Date Due   Medicare Annual Wellness (AWV)  Never done   Hepatitis C Screening  Never done   DTaP/Tdap/Td (1 - Tdap) Never done   MAMMOGRAM  Never done   Zoster Vaccines- Shingrix (1 of 2) Never done   DEXA SCAN  Never done   Pneumonia Vaccine 41+ Years old (2 of 2 - PCV) 02/02/2019   COVID-19 Vaccine (1 - 2023-24 season) Never done   INFLUENZA VACCINE  07/25/2023 (Originally 11/25/2022)   Colonoscopy  06/02/2028   HPV VACCINES  Aged Out    There are no preventive care reminders to display for this patient.  Lab Results  Component Value Date   TSH 2.906 02/01/2018   Lab Results  Component Value Date   WBC 4.8 12/09/2022   HGB 12.3 12/09/2022   HCT 37.9 12/09/2022   MCV 91.8 12/09/2022   PLT 334.0 12/09/2022   Lab Results  Component Value Date   NA 136 12/09/2022   K 3.0 (L) 12/09/2022   CO2 28 12/09/2022   GLUCOSE 120 (H) 12/09/2022   BUN 11 12/09/2022   CREATININE 0.81 12/09/2022   BILITOT 0.4 12/09/2022   ALKPHOS 88 12/09/2022   AST 13 12/09/2022   ALT 12 12/09/2022   PROT 6.9 12/09/2022   ALBUMIN 4.3 12/09/2022   CALCIUM 10.0 12/09/2022   ANIONGAP 12 10/31/2022   GFR 71.99 12/09/2022   Lab Results  Component Value Date   CHOL 182 12/09/2022   Lab Results  Component Value Date   HDL 49.40  12/09/2022   Lab Results  Component Value Date   LDLCALC 99 12/09/2022   Lab Results  Component Value Date   TRIG 170.0 (H) 12/09/2022   Lab Results  Component Value Date   CHOLHDL 4 12/09/2022   Lab Results  Component Value Date   HGBA1C 6.5 12/09/2022      Assessment & Plan:  Hypokalemia Assessment & Plan: Lab Results  Component Value Date   K 3.0 (L) 12/09/2022  Advised patient to increase intake of food containing potassium. Labs ordered   Orders: -     Potassium; Future  Hyperlipidemia, unspecified hyperlipidemia type Assessment & Plan: Lab Results  Component Value Date   CHOL 182 12/09/2022   HDL 49.40 12/09/2022   LDLCALC 99 12/09/2022   TRIG 170.0 (H) 12/09/2022   CHOLHDL 4 12/09/2022  Advised patient to monitor diet and follow regular exercise schedule. Will continue to monitor    Anxiety and depression Assessment & Plan: PHQ-9 score 12 and GAD-7 score 8. Continue Effexor daily. Referral sent to psychiatrist. Provide completed  Orders: -     Ambulatory referral to Psychiatry  Other orders -     Losartan Potassium-HCTZ; Take 1 tablet by mouth daily.  Dispense: 90 tablet; Refill: 2 -     Metoprolol Tartrate; Take 1 tablet (50 mg total) by mouth 2 (two) times daily.  Dispense: 90 tablet; Refill: 2 -     Simvastatin; Take 1 tablet (40 mg total) by mouth at bedtime.  Dispense: 90 tablet; Refill: 1 -     metFORMIN HCl; Take 1 tablet (  500 mg total) by mouth 2 (two) times daily with a meal.  Dispense: 180 tablet; Refill: 3 -     traZODone HCl; Take 0.5-1 tablets (25-50 mg total) by mouth at bedtime as needed for sleep.  Dispense: 30 tablet; Refill: 1    Follow-up: Return in about 4 months (around 04/22/2023).   Kara Dies, NP

## 2022-12-26 ENCOUNTER — Encounter: Payer: Self-pay | Admitting: Nurse Practitioner

## 2022-12-26 NOTE — Assessment & Plan Note (Signed)
Lab Results  Component Value Date   CHOL 182 12/09/2022   HDL 49.40 12/09/2022   LDLCALC 99 12/09/2022   TRIG 170.0 (H) 12/09/2022   CHOLHDL 4 12/09/2022  Advised patient to monitor diet and follow regular exercise schedule. Will continue to monitor

## 2022-12-26 NOTE — Assessment & Plan Note (Signed)
Lab Results  Component Value Date   K 3.0 (L) 12/09/2022  Advised patient to increase intake of food containing potassium. Labs ordered

## 2022-12-26 NOTE — Assessment & Plan Note (Addendum)
PHQ-9 score 12 and GAD-7 score 8. Continue Effexor daily. Referral sent to psychiatrist. Provide completed

## 2023-01-04 ENCOUNTER — Other Ambulatory Visit: Payer: Medicare HMO

## 2023-01-13 ENCOUNTER — Ambulatory Visit: Payer: Medicare HMO | Admitting: Nurse Practitioner

## 2023-01-17 ENCOUNTER — Telehealth: Payer: Self-pay | Admitting: Nurse Practitioner

## 2023-01-17 NOTE — Telephone Encounter (Signed)
Copied from CRM [id number]. Topic: Medicare AWV >> Jan 17, 2023  2:28 PM Payton Doughty wrote: Reason for CRM: LM 01/17/2023 to schedule AWV   Verlee Rossetti; Care Guide Ambulatory Clinical Support [facility] Direct Dial: [phone]

## 2023-01-28 ENCOUNTER — Telehealth: Payer: Self-pay

## 2023-01-28 NOTE — Telephone Encounter (Signed)
Sierra Lewis called from [facility] to state we filled out a form 3051 for home care and she is missing the medicaid number.  Sierra Lewis states she needs this today.

## 2023-01-28 NOTE — Telephone Encounter (Signed)
Paperwork refaxed with pts Medicaid number

## 2023-02-14 DIAGNOSIS — R519 Headache, unspecified: Secondary | ICD-10-CM | POA: Diagnosis not present

## 2023-02-14 DIAGNOSIS — R0683 Snoring: Secondary | ICD-10-CM | POA: Diagnosis not present

## 2023-02-14 DIAGNOSIS — G44219 Episodic tension-type headache, not intractable: Secondary | ICD-10-CM | POA: Diagnosis not present

## 2023-04-05 ENCOUNTER — Ambulatory Visit (INDEPENDENT_AMBULATORY_CARE_PROVIDER_SITE_OTHER): Payer: 59 | Admitting: Family Medicine

## 2023-04-05 ENCOUNTER — Ambulatory Visit (INDEPENDENT_AMBULATORY_CARE_PROVIDER_SITE_OTHER): Payer: 59

## 2023-04-05 ENCOUNTER — Encounter: Payer: Self-pay | Admitting: Family Medicine

## 2023-04-05 VITALS — BP 138/80 | HR 71 | Temp 98.0°F | Resp 18 | Ht 63.0 in | Wt 190.4 lb

## 2023-04-05 DIAGNOSIS — R053 Chronic cough: Secondary | ICD-10-CM

## 2023-04-05 DIAGNOSIS — R059 Cough, unspecified: Secondary | ICD-10-CM | POA: Diagnosis not present

## 2023-04-05 DIAGNOSIS — Z23 Encounter for immunization: Secondary | ICD-10-CM

## 2023-04-05 DIAGNOSIS — K219 Gastro-esophageal reflux disease without esophagitis: Secondary | ICD-10-CM | POA: Diagnosis not present

## 2023-04-05 DIAGNOSIS — J029 Acute pharyngitis, unspecified: Secondary | ICD-10-CM

## 2023-04-05 DIAGNOSIS — H9203 Otalgia, bilateral: Secondary | ICD-10-CM

## 2023-04-05 LAB — POCT RAPID STREP A (OFFICE): Rapid Strep A Screen: NEGATIVE

## 2023-04-05 NOTE — Patient Instructions (Addendum)
It was a pleasure meeting you today. Thank you for allowing me to take part in your health care.  Our goals for today as we discussed include:  Rapid strep is negative for infection  Will get chest xray today and blood work.  If anything shows will call in prescription.  I think likely this is viral and will slowly run it's course. If you develop any fevers, shortness of breath notify MD. Increase fluids Tylenol 325-500 mg every 6 hours as needed Stay well hydrated Rest as needed with frequent repositioning and ambulation.  Increase activity as soon as tolerated to help with recovery. Continue to use your flonase daily.  Received Pneumonia 20 vaccine today. No further vaccines needed.  If you have worsening symptoms, especially difficulty breathing please call 911 or have someone take you to the emergency department.    This is a list of the screening recommended for you and due dates:  Health Maintenance  Topic Date Due   Medicare Annual Wellness Visit  Never done   Hepatitis C Screening  Never done   DTaP/Tdap/Td vaccine (1 - Tdap) Never done   Mammogram  Never done   Zoster (Shingles) Vaccine (1 of 2) Never done   DEXA scan (bone density measurement)  Never done   Pneumonia Vaccine (2 of 2 - PCV) 02/02/2019   COVID-19 Vaccine (1 - 2023-24 season) Never done   Flu Shot  07/25/2023*   Colon Cancer Screening  06/02/2028   HPV Vaccine  Aged Out  *Topic was postponed. The date shown is not the original due date.    If you have any questions or concerns, please do not hesitate to call the office at [phone].  I look forward to our next visit and until then take care and stay safe.  Regards,   Dana Allan, MD   [facility]

## 2023-04-05 NOTE — Progress Notes (Signed)
SUBJECTIVE:   Chief Complaint  Patient presents with   Cough    Greenish yellow mucus. X 2 weeks   Sore Throat    X 2 weeks   HPI Presents to clinic for acute visit  Discussed the use of AI scribe software for clinical note transcription with the patient, who gave verbal consent to proceed.  History of Present Illness The patient presents to the clinic with a persistent cough and cold symptoms lasting over three weeks. She reports no associated fever, chills, shortness of breath, or wheezing, but does note congestion and a stuffy or runny nose. The patient has been eating and drinking normally and denies any difficulty breathing, chest pain, nausea, vomiting, or headaches. She reports a sore throat due to frequent coughing.  The patient has been around several individuals who were recently ill, some of whom were diagnosed with pneumonia. She has been using Flonase at least once daily for symptom management. The patient denies any history of lung issues.  The patient also reports soreness in both ears but denies any changes in hearing, muffled sounds, or ringing in the ears. The cough is reportedly worse in the morning. The patient denies personal history of smoking but admits to occasional exposure to secondhand smoke. She has not received the flu or COVID-19 vaccines this year.  The patient has been taking omeprazole daily for an unspecified gastrointestinal issue and reports no missed doses. She also reports a recent weight loss, though the amount is unspecified. The patient's symptoms suggest a possible viral infection, but further diagnostic testing, including a chest x-ray and blood work, is planned to rule out other potential causes.    PERTINENT PMH / PSH: As above  OBJECTIVE:  BP 138/80   Pulse 71   Temp 98 F (36.7 C)   Resp 18   Ht 5\' 3"  (1.6 m)   Wt 190 lb 6 oz (86.4 kg)   SpO2 96%   BMI 33.72 kg/m    Physical Exam Vitals reviewed.  Constitutional:       General: She is not in acute distress.    Appearance: Normal appearance. She is normal weight. She is not ill-appearing, toxic-appearing or diaphoretic.  HENT:     Right Ear: Tympanic membrane and ear canal normal. Tenderness present. No drainage.     Left Ear: Tympanic membrane and ear canal normal. Tenderness present. No drainage.  Eyes:     General:        Right eye: No discharge.        Left eye: No discharge.     Conjunctiva/sclera: Conjunctivae normal.  Cardiovascular:     Rate and Rhythm: Normal rate and regular rhythm.     Heart sounds: Normal heart sounds.  Pulmonary:     Effort: Pulmonary effort is normal.     Breath sounds: Normal breath sounds.  Abdominal:     General: Bowel sounds are normal.  Musculoskeletal:        General: Normal range of motion.  Skin:    General: Skin is warm and dry.  Neurological:     General: No focal deficit present.     Mental Status: She is alert and oriented to person, place, and time. Mental status is at baseline.  Psychiatric:        Mood and Affect: Mood normal.        Behavior: Behavior normal.        Thought Content: Thought content normal.  Judgment: Judgment normal.        04/05/2023    8:19 AM 12/21/2022    1:29 PM 12/02/2022    2:55 PM  Depression screen PHQ 2/9  Decreased Interest 3 2 3   Down, Depressed, Hopeless 2 2 3   PHQ - 2 Score 5 4 6   Altered sleeping 3 2 3   Tired, decreased energy 2 2 3   Change in appetite 1 1 3   Feeling bad or failure about yourself  0 1 2  Trouble concentrating 2 1 3   Moving slowly or fidgety/restless 2 1 3   Suicidal thoughts 0 0 0  PHQ-9 Score 15 12 23   Difficult doing work/chores Somewhat difficult Somewhat difficult Somewhat difficult      04/05/2023    8:20 AM 12/21/2022    1:30 PM 12/02/2022    2:55 PM  GAD 7 : Generalized Anxiety Score  Nervous, Anxious, on Edge 1 1 3   Control/stop worrying 1 1 3   Worry too much - different things 1 1 3   Trouble relaxing 2 2 2   Restless 1  1 1   Easily annoyed or irritable 1 2 1   Afraid - awful might happen 0 0 1  Total GAD 7 Score 7 8 14   Anxiety Difficulty Somewhat difficult Somewhat difficult Somewhat difficult    ASSESSMENT/PLAN:  Persistent cough Assessment & Plan: Persistent cough and congestion for over two weeks. No fever, chills, shortness of breath, or wheezing. Exposure to individuals with pneumonia. Negative strep test. Lungs clear on auscultation. -Order chest x-ray and blood work to further evaluate. -Continue Flonase daily.   Orders: -     DG Chest 2 View; Future -     CBC  Sore throat Assessment & Plan: No difficulty swallowing Able to hydrate well Normal throat exam Likely from increase cough -Symptomatic management, Cepacol lozenges as needed, honey tea as needed to help soothe throat  Orders: -     POCT rapid strep A  Encounter for immunization -     Pneumococcal conjugate vaccine 20-valent  Otalgia, bilateral Assessment & Plan: Bilateral ear pain without hearing loss, tinnitus, or signs of infection on examination. -Monitor symptoms.    Gastroesophageal reflux disease, unspecified whether esophagitis present Assessment & Plan: On daily Omeprazole, no missed doses. -Continue Omeprazole as prescribed.     PDMP reviewed  Return if symptoms worsen or fail to improve, for PCP.  Dana Allan, MD

## 2023-04-08 ENCOUNTER — Encounter: Payer: Self-pay | Admitting: Family Medicine

## 2023-04-08 DIAGNOSIS — R053 Chronic cough: Secondary | ICD-10-CM | POA: Insufficient documentation

## 2023-04-08 DIAGNOSIS — Z23 Encounter for immunization: Secondary | ICD-10-CM | POA: Insufficient documentation

## 2023-04-08 DIAGNOSIS — H9203 Otalgia, bilateral: Secondary | ICD-10-CM | POA: Insufficient documentation

## 2023-04-08 DIAGNOSIS — K219 Gastro-esophageal reflux disease without esophagitis: Secondary | ICD-10-CM | POA: Insufficient documentation

## 2023-04-08 DIAGNOSIS — J029 Acute pharyngitis, unspecified: Secondary | ICD-10-CM | POA: Insufficient documentation

## 2023-04-08 NOTE — Assessment & Plan Note (Signed)
No difficulty swallowing Able to hydrate well Normal throat exam Likely from increase cough -Symptomatic management, Cepacol lozenges as needed, honey tea as needed to help soothe throat

## 2023-04-08 NOTE — Assessment & Plan Note (Signed)
Bilateral ear pain without hearing loss, tinnitus, or signs of infection on examination. -Monitor symptoms.

## 2023-04-08 NOTE — Assessment & Plan Note (Addendum)
Persistent cough and congestion for over two weeks. No fever, chills, shortness of breath, or wheezing. Exposure to individuals with pneumonia. Negative strep test. Lungs clear on auscultation. -Order chest x-ray and blood work to further evaluate. -Continue Flonase daily.

## 2023-04-08 NOTE — Assessment & Plan Note (Signed)
On daily Omeprazole, no missed doses. -Continue Omeprazole as prescribed.

## 2023-04-11 ENCOUNTER — Encounter: Payer: Self-pay | Admitting: Family Medicine

## 2023-04-22 ENCOUNTER — Ambulatory Visit: Payer: Medicare HMO | Admitting: Nurse Practitioner

## 2023-05-17 DIAGNOSIS — G44219 Episodic tension-type headache, not intractable: Secondary | ICD-10-CM | POA: Diagnosis not present

## 2023-05-17 DIAGNOSIS — R519 Headache, unspecified: Secondary | ICD-10-CM | POA: Diagnosis not present

## 2023-05-17 DIAGNOSIS — R0683 Snoring: Secondary | ICD-10-CM | POA: Diagnosis not present

## 2023-05-17 DIAGNOSIS — I10 Essential (primary) hypertension: Secondary | ICD-10-CM | POA: Diagnosis not present

## 2023-06-29 ENCOUNTER — Other Ambulatory Visit: Payer: Self-pay | Admitting: Internal Medicine

## 2023-06-29 DIAGNOSIS — Z1159 Encounter for screening for other viral diseases: Secondary | ICD-10-CM | POA: Diagnosis not present

## 2023-06-29 DIAGNOSIS — Z23 Encounter for immunization: Secondary | ICD-10-CM | POA: Diagnosis not present

## 2023-06-29 DIAGNOSIS — Z0000 Encounter for general adult medical examination without abnormal findings: Secondary | ICD-10-CM | POA: Diagnosis not present

## 2023-06-29 DIAGNOSIS — Z87898 Personal history of other specified conditions: Secondary | ICD-10-CM | POA: Diagnosis not present

## 2023-06-29 DIAGNOSIS — Z780 Asymptomatic menopausal state: Secondary | ICD-10-CM | POA: Diagnosis not present

## 2023-06-29 DIAGNOSIS — Z1211 Encounter for screening for malignant neoplasm of colon: Secondary | ICD-10-CM | POA: Diagnosis not present

## 2023-06-29 DIAGNOSIS — Z1231 Encounter for screening mammogram for malignant neoplasm of breast: Secondary | ICD-10-CM

## 2023-06-29 DIAGNOSIS — R7303 Prediabetes: Secondary | ICD-10-CM | POA: Diagnosis not present

## 2023-06-29 DIAGNOSIS — I10 Essential (primary) hypertension: Secondary | ICD-10-CM | POA: Diagnosis not present

## 2023-06-29 DIAGNOSIS — Z1389 Encounter for screening for other disorder: Secondary | ICD-10-CM | POA: Diagnosis not present

## 2023-07-27 DIAGNOSIS — M8588 Other specified disorders of bone density and structure, other site: Secondary | ICD-10-CM | POA: Diagnosis not present

## 2024-01-31 ENCOUNTER — Ambulatory Visit
Admission: RE | Admit: 2024-01-31 | Discharge: 2024-01-31 | Disposition: A | Discharge status: Home / Self Care | Source: Ambulatory Visit | Attending: Internal Medicine | Admitting: Internal Medicine

## 2024-01-31 DIAGNOSIS — Z1231 Encounter for screening mammogram for malignant neoplasm of breast: Secondary | ICD-10-CM | POA: Insufficient documentation

## 2024-05-10 ENCOUNTER — Ambulatory Visit: Admitting: Nurse Practitioner

## 2024-05-14 ENCOUNTER — Encounter: Payer: Self-pay | Admitting: Nurse Practitioner
